# Patient Record
Sex: Female | Born: 1990 | Race: White | Hispanic: No | Marital: Married | Smoking: Current every day smoker
Health system: Southern US, Community
[De-identification: ages and names within clinical notes are randomized; demographics above are authoritative.]

## PROBLEM LIST (undated history)

## (undated) ENCOUNTER — Inpatient Hospital Stay: Payer: Self-pay

## (undated) DIAGNOSIS — D649 Anemia, unspecified: Secondary | ICD-10-CM

## (undated) DIAGNOSIS — I493 Ventricular premature depolarization: Secondary | ICD-10-CM

## (undated) DIAGNOSIS — J45909 Unspecified asthma, uncomplicated: Secondary | ICD-10-CM

## (undated) HISTORY — PX: NASAL SINUS SURGERY: SHX719

---
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
## 2009-01-05 ENCOUNTER — Encounter: Payer: Self-pay | Admitting: Pediatrics

## 2009-02-05 ENCOUNTER — Encounter: Payer: Self-pay | Admitting: Pediatrics

## 2012-05-11 ENCOUNTER — Ambulatory Visit: Payer: Self-pay | Admitting: Family Medicine

## 2015-11-08 DIAGNOSIS — I493 Ventricular premature depolarization: Secondary | ICD-10-CM

## 2015-11-08 HISTORY — DX: Ventricular premature depolarization: I49.3

## 2016-05-13 ENCOUNTER — Other Ambulatory Visit: Payer: Self-pay | Admitting: Obstetrics and Gynecology

## 2016-05-13 DIAGNOSIS — Z369 Encounter for antenatal screening, unspecified: Secondary | ICD-10-CM

## 2016-05-26 ENCOUNTER — Ambulatory Visit: Payer: Self-pay

## 2016-05-30 ENCOUNTER — Ambulatory Visit (HOSPITAL_BASED_OUTPATIENT_CLINIC_OR_DEPARTMENT_OTHER)
Admission: RE | Admit: 2016-05-30 | Discharge: 2016-05-30 | Disposition: A | Discharge status: Home / Self Care | Payer: Managed Care, Other (non HMO) | Source: Ambulatory Visit | Attending: Obstetrics and Gynecology | Admitting: Obstetrics and Gynecology

## 2016-05-30 ENCOUNTER — Ambulatory Visit
Admission: RE | Admit: 2016-05-30 | Discharge: 2016-05-30 | Disposition: A | Discharge status: Home / Self Care | Payer: Managed Care, Other (non HMO) | Source: Ambulatory Visit | Attending: Obstetrics and Gynecology | Admitting: Obstetrics and Gynecology

## 2016-05-30 DIAGNOSIS — O2620 Pregnancy care for patient with recurrent pregnancy loss, unspecified trimester: Secondary | ICD-10-CM | POA: Insufficient documentation

## 2016-05-30 DIAGNOSIS — N83292 Other ovarian cyst, left side: Secondary | ICD-10-CM | POA: Insufficient documentation

## 2016-05-30 DIAGNOSIS — O2621 Pregnancy care for patient with recurrent pregnancy loss, first trimester: Secondary | ICD-10-CM

## 2016-05-30 DIAGNOSIS — Z3481 Encounter for supervision of other normal pregnancy, first trimester: Secondary | ICD-10-CM | POA: Diagnosis not present

## 2016-05-30 DIAGNOSIS — Z369 Encounter for antenatal screening, unspecified: Secondary | ICD-10-CM

## 2016-05-30 DIAGNOSIS — Z3A12 12 weeks gestation of pregnancy: Secondary | ICD-10-CM | POA: Insufficient documentation

## 2016-05-30 DIAGNOSIS — Z36 Encounter for antenatal screening of mother: Secondary | ICD-10-CM | POA: Insufficient documentation

## 2016-05-30 HISTORY — DX: Ventricular premature depolarization: I49.3

## 2016-05-30 NOTE — Progress Notes (Signed)
Michelle Wells, MS, CGC performed an integral service incident to the physician's initial service.  I was physically present in the clinical area and was immediately available to render assistance.   [name] C Raevon Broom  

## 2016-05-30 NOTE — Progress Notes (Signed)
Referring physician:  [facility] Length of Consultation: 45 minutes   Michelle Benton  was referred to [facility] for genetic counseling to review prenatal screening and testing options.  This note summarizes the information we discussed.    We offered the following routine screening tests for this pregnancy:  First trimester screening, which includes nuchal translucency ultrasound screen and first trimester maternal serum marker screening.  The nuchal translucency has approximately an 80% detection rate for Down syndrome and can be positive for other chromosome abnormalities as well as congenital heart defects.  When combined with a maternal serum marker screening, the detection rate is up to 90% for Down syndrome and up to 97% for trisomy 18.     Maternal serum marker screening, a blood test that measures pregnancy proteins, can provide risk assessments for Down syndrome, trisomy 18, and open neural tube defects (spina bifida, anencephaly). Because it does not directly examine the fetus, it cannot positively diagnose or rule out these problems.  Targeted ultrasound uses high frequency sound waves to create an image of the developing fetus.  An ultrasound is often recommended as a routine means of evaluating the pregnancy.  It is also used to screen for fetal anatomy problems (for example, a heart defect) that might be suggestive of a chromosomal or other abnormality.   Should these screening tests indicate an increased concern, then the following additional testing options would be offered:  The chorionic villus sampling procedure is available for first trimester chromosome analysis.  This involves the withdrawal of a small amount of chorionic villi (tissue from the developing placenta).  Risk of pregnancy loss is estimated to be approximately 1 in 200 to 1 in 100 (0.5 to 1%).  There is approximately a 1% (1 in 100) chance that the CVS chromosome results will be unclear.   Chorionic villi cannot be tested for neural tube defects.     Amniocentesis involves the removal of a small amount of amniotic fluid from the sac surrounding the fetus with the use of a thin needle inserted through the maternal abdomen and uterus.  Ultrasound guidance is used throughout the procedure.  Fetal cells from amniotic fluid are directly evaluated and > 99.5% of chromosome problems and > 98% of open neural tube defects can be detected. This procedure is generally performed after the 15th week of pregnancy.  The main risks to this procedure include complications leading to miscarriage in less than 1 in 200 cases (0.5%).  As another option for information if the pregnancy is suspected to be an an increased chance for certain chromosome conditions, we also reviewed the availability of cell free fetal DNA testing from maternal blood to determine whether or not the baby may have either Down syndrome, trisomy 9, or trisomy 21.  This test utilizes a maternal blood sample and DNA sequencing technology to isolate circulating cell free fetal DNA from maternal plasma.  The fetal DNA can then be analyzed for DNA sequences that are derived from the three most common chromosomes involved in aneuploidy, chromosomes 13, 18, and 21.  If the overall amount of DNA is greater than the expected level for any of these chromosomes, aneuploidy is suspected.  While we do not consider it a replacement for invasive testing and karyotype analysis, a negative result from this testing would be reassuring, though not a guarantee of a normal chromosome complement for the baby.  An abnormal result is certainly suggestive of an abnormal chromosome complement, though  we would still recommend CVS or amniocentesis to confirm any findings from this testing.  Cystic Fibrosis and Spinal Muscular Atrophy (SMA) screening were also discussed with the patient. Both conditions are recessive, which means that both parents must be carriers in  order to have a child with the disease.  Cystic fibrosis (CF) is one of the most common genetic conditions in persons of Caucasian ancestry.  This condition occurs in approximately 1 in 2,500 Caucasian persons and results in thickened secretions in the lungs, digestive, and reproductive systems.  For a baby to be at risk for having CF, both of the parents must be carriers for this condition.  Approximately 1 in 50 Caucasian persons is a carrier for CF.  Current carrier testing looks for the most common mutations in the gene for CF and can detect approximately 90% of carriers in the Caucasian population.  This means that the carrier screening can greatly reduce, but cannot eliminate, the chance for an individual to have a child with CF.  If an individual is found to be a carrier for CF, then carrier testing would be available for the partner. As part of [place]'s newborn screening profile, all babies born in the state of [place] will have a two-tier screening process.  Specimens are first tested to determine the concentration of immunoreactive trypsinogen (IRT).  The top 5% of specimens with the highest IRT values then undergo DNA testing using a panel of over 40 common CF mutations. SMA is a neurodegenerative disorder that leads to atrophy of skeletal muscle and overall weakness.  This condition is also more prevalent in the Caucasian population, with 1 in 40-1 in 60 persons being a carrier and 1 in 6,000-1 in 10,000 children being affected.  There are multiple forms of the disease, with some causing death in infancy to other forms with survival into adulthood.  The genetics of SMA is complex, but carrier screening can detect up to 95% of carriers in the Caucasian population.  Similar to CF, a negative result can greatly reduce, but cannot eliminate, the chance to have a child with SMA.  We obtained a detailed family history and pregnancy history.  Michelle Benton reported that the father of the baby,  Michelle Benton, is 30 years old.  Advanced paternal age is known to be associated with an increased chance for several fetal health conditions.  Therefore, we also reviewed the issues surrounding advanced paternal age.  There is known to be an increase in single gene abnormalities in the children of men who are over age 85-50, though the specific age varies in different studies.  This chance is estimated to be no greater than 2%. These mutations may result in birth defects or genetic syndromes which may show differences on ultrasound in the second trimester.  Therefore, we recommend a detailed anatomy ultrasound at approximately [redacted] weeks gestation. A normal ultrasound cannot rule out these disorders. Advanced paternal age has also been associated with other conditions including autism spectrum disorders, schizophrenia and childhood acute lymphoblastic leukemia.  It is important to be aware of these associations, though no clinical testing is available for these conditions at this time.  Michelle Benton is also reported to have a paternal relative with deafness, the cause of which is not known.  There may be many different causes for hearing loss, some of which are inherited and others that are not.  We informed the patient that approximately 50% of congenital deafness is due to inherited changes within specific genes.  We reviewed the various causes for deafness including single gene disorders (both autosomal recessive and autosomal dominant), in-utero infection processes and sporadic occurrence.  Without a specific diagnosis as the cause for this condition, we cannot accurately determine the chance for this pregnancy to be similarly affected or offer any prenatal testing.  If more is learned about the cause for the hearing loss, we are happy to review this further.  Michelle Benton also reported a maternal first cousin with a "hole" in the heart.  We discussed that congenital heart defects (CHDs) occur in approximately 1 in 200 births.   Congenital heart defects are often thought to be multifactorial, meaning due to complex interactions between genetic and environmental factors, although some specific genetic changes and syndromes are associated with congenital heart defects.  It would be important to know what tests were done on this cousin in order to better assess the risk in this family.   In the absence of an underlying genetic condition, the chance of congenital heart defects in fourth degree relative of individuals with heart defects is not expected to be significantly increased above the background risk. Lastly, the patient's mother also had a history of four miscarriages, though she is said to have had PCOS and gynecological issues which led to a very early hysterectomy so it is not clear the reason for those losses.  The remainder of the family history is unremarkable for birth defects, developmental difference and known genetic conditions.   Michelle Benton reported that this is her fourth pregnancy.  The first three ended in very early spontaneous loss.  Following the losses, her OB ordered an evaluation including antiphosolipid antibodies, thrombophilia evaluation and maternal karyotype, all of which were normal.  She reported no complications or exposures in the current pregnancy that would be expected to increase the risk for birth defects.  After consideration of the options, Michelle Benton elected to proceed with first trimester screening and to decline carrier screening for both CF and SMA.  An ultrasound was performed at the time of the visit.  The gestational age was consistent with  13 weeks.  Fetal anatomy could not be assessed due to early gestational age.  Please refer to the ultrasound report for details of that study.  Michelle Benton was encouraged to call with questions or concerns.  We can be contacted at [phone].   Cherly Anderson, MS, CGC

## 2016-06-02 ENCOUNTER — Telehealth: Payer: Self-pay | Admitting: Obstetrics and Gynecology

## 2016-06-02 NOTE — Telephone Encounter (Signed)
Ms. Boyte  elected to undergo First Trimester screening on 05/30/16.  To review, first trimester screening, includes nuchal translucency ultrasound screen and/or first trimester maternal serum marker screening.  The nuchal translucency has approximately an 80% detection rate for Down syndrome and can be positive for other chromosome abnormalities as well as heart defects.  When combined with a maternal serum marker screening, the detection rate is up to 90% for Down syndrome and up to 97% for trisomy 13 and 18.     The results of the First Trimester Nuchal Translucency and Biochemical Screening were within normal range.  The risk for Down syndrome is now estimated to be less than 1 in 10,000.  The risk for Trisomy 13/18 is 1 in 9,464.  Should more definitive information be desired, we would offer amniocentesis.  Because we do not yet know the effectiveness of combined first and second trimester screening, we do not recommend a maternal serum screen to assess the chance for chromosome conditions.  However, if screening for neural tube defects is desired, maternal serum screening for AFP only can be performed between 15 and [redacted] weeks gestation.    Cherly Anderson, MS, CGC

## 2016-11-07 NOTE — L&D Delivery Note (Signed)
Delivery Note  First Stage: Labor onset: 11/27/16 @ 0415am  Augmentation : Pitocin  Analgesia Eliezer Lofts/Anesthesia intrapartum: Nitrous oxide, epidural SROM 11/27/16 at 0415  Second Stage: [facility] dilation [facility] 0145 [facility] Baseline: 145 bpm/ moderate variability/ +accels/ occasional variable decels to nadir of 70 bpm with each ctx   Delivery of a viable female "Wendelyn BreslowMark Wayne" at [phone] by Carlean JewsMeredith Areesha Dehaven, CNM in LOA position Double nuchal cord - reduced over head x 2 Cord double clamped after cessation of pulsation, cut by Support doula friend  Cord blood sample collected   Third Stage: Placenta delivered via Tomasa BlaseSchultz intact with 3 VC @ 0347 Placenta disposition: hospital disposal  Uterine tone firm with massage / bleeding moderate, but slowed with fundal massage and Pitocin 500mg  IV bolus  2nd degree laceration identified  Anesthesia for repair: 1% Lidocaine 30mL Repair: 2.0 and 3.0 vicryl Est. Blood Loss (mL): 400mL  Complications: none  Mom to postpartum.  Baby to Couplet care / Skin to Skin.  Newborn: Birth Weight: 3340 grams (7#9oz) Apgar Scores: 7, 9 Feeding planned: Breast  Carlean JewsMeredith Felipa Laroche, CNM

## 2016-11-09 LAB — OB RESULTS CONSOLE GBS: GBS: NEGATIVE

## 2016-11-27 ENCOUNTER — Inpatient Hospital Stay: Payer: Managed Care, Other (non HMO) | Admitting: Anesthesiology

## 2016-11-27 ENCOUNTER — Encounter: Payer: Self-pay | Admitting: *Deleted

## 2016-11-27 ENCOUNTER — Inpatient Hospital Stay
Admission: EM | Admit: 2016-11-27 | Discharge: 2016-11-29 | DRG: 775 | Disposition: A | Discharge status: Home / Self Care | Payer: Managed Care, Other (non HMO) | Attending: Obstetrics and Gynecology | Admitting: Obstetrics and Gynecology

## 2016-11-27 DIAGNOSIS — O99214 Obesity complicating childbirth: Secondary | ICD-10-CM | POA: Diagnosis present

## 2016-11-27 DIAGNOSIS — O4202 Full-term premature rupture of membranes, onset of labor within 24 hours of rupture: Secondary | ICD-10-CM | POA: Diagnosis present

## 2016-11-27 DIAGNOSIS — O2621 Pregnancy care for patient with recurrent pregnancy loss, first trimester: Secondary | ICD-10-CM

## 2016-11-27 DIAGNOSIS — Z87891 Personal history of nicotine dependence: Secondary | ICD-10-CM

## 2016-11-27 DIAGNOSIS — Z6836 Body mass index (BMI) 36.0-36.9, adult: Secondary | ICD-10-CM | POA: Diagnosis not present

## 2016-11-27 DIAGNOSIS — Z3A38 38 weeks gestation of pregnancy: Secondary | ICD-10-CM | POA: Diagnosis not present

## 2016-11-27 DIAGNOSIS — O9902 Anemia complicating childbirth: Secondary | ICD-10-CM | POA: Diagnosis present

## 2016-11-27 DIAGNOSIS — D72829 Elevated white blood cell count, unspecified: Secondary | ICD-10-CM | POA: Diagnosis present

## 2016-11-27 DIAGNOSIS — E669 Obesity, unspecified: Secondary | ICD-10-CM | POA: Diagnosis present

## 2016-11-27 DIAGNOSIS — O9912 Other diseases of the blood and blood-forming organs and certain disorders involving the immune mechanism complicating childbirth: Secondary | ICD-10-CM | POA: Diagnosis present

## 2016-11-27 DIAGNOSIS — Z369 Encounter for antenatal screening, unspecified: Secondary | ICD-10-CM

## 2016-11-27 DIAGNOSIS — D649 Anemia, unspecified: Secondary | ICD-10-CM | POA: Diagnosis present

## 2016-11-27 LAB — CHLAMYDIA/NGC RT PCR (ARMC ONLY)
Chlamydia Tr: NOT DETECTED
N gonorrhoeae: NOT DETECTED

## 2016-11-27 LAB — COMPREHENSIVE METABOLIC PANEL
ALT: 9 U/L — AB (ref 14–54)
ANION GAP: 10 (ref 5–15)
AST: 23 U/L (ref 15–41)
Albumin: 3.3 g/dL — ABNORMAL LOW (ref 3.5–5.0)
Alkaline Phosphatase: 138 U/L — ABNORMAL HIGH (ref 38–126)
BUN: 11 mg/dL (ref 6–20)
CALCIUM: 9.4 mg/dL (ref 8.9–10.3)
CO2: 20 mmol/L — AB (ref 22–32)
CREATININE: 0.57 mg/dL (ref 0.44–1.00)
Chloride: 107 mmol/L (ref 101–111)
Glucose, Bld: 69 mg/dL (ref 65–99)
Potassium: 3.4 mmol/L — ABNORMAL LOW (ref 3.5–5.1)
Sodium: 137 mmol/L (ref 135–145)
TOTAL PROTEIN: 6.4 g/dL — AB (ref 6.5–8.1)
Total Bilirubin: 0.3 mg/dL (ref 0.3–1.2)

## 2016-11-27 LAB — TYPE AND SCREEN
ABO/RH(D): O POS
ANTIBODY SCREEN: NEGATIVE

## 2016-11-27 LAB — PROTEIN / CREATININE RATIO, URINE
Creatinine, Urine: 41 mg/dL
Protein Creatinine Ratio: 0.17 mg/mg{Cre} — ABNORMAL HIGH (ref 0.00–0.15)
Total Protein, Urine: 7 mg/dL

## 2016-11-27 LAB — CBC
HEMATOCRIT: 38.7 % (ref 35.0–47.0)
Hemoglobin: 13.6 g/dL (ref 12.0–16.0)
MCH: 32.5 pg (ref 26.0–34.0)
MCHC: 35.2 g/dL (ref 32.0–36.0)
MCV: 92.3 fL (ref 80.0–100.0)
PLATELETS: 192 10*3/uL (ref 150–440)
RBC: 4.19 MIL/uL (ref 3.80–5.20)
RDW: 13 % (ref 11.5–14.5)
WBC: 13 10*3/uL — AB (ref 3.6–11.0)

## 2016-11-27 LAB — URIC ACID: URIC ACID, SERUM: 7.2 mg/dL — AB (ref 2.3–6.6)

## 2016-11-27 MED ORDER — SOD CITRATE-CITRIC ACID 500-334 MG/5ML PO SOLN: 30.0000 mL | ORAL | Status: DC | PRN

## 2016-11-27 MED ORDER — OXYTOCIN BOLUS FROM INFUSION
500.0000 mL | Freq: Once | INTRAVENOUS | Status: AC
  Administered 2016-11-28: 500 mL via INTRAVENOUS

## 2016-11-27 MED ORDER — FENTANYL 2.5 MCG/ML W/ROPIVACAINE 0.2% IN NS 100 ML EPIDURAL INFUSION (ARMC-ANES)
EPIDURAL | Status: DC | PRN
  Administered 2016-11-27: 10 mL/h via EPIDURAL

## 2016-11-27 MED ORDER — LACTATED RINGERS IV SOLN: 500.0000 mL | INTRAVENOUS | Status: DC | PRN

## 2016-11-27 MED ORDER — MISOPROSTOL 200 MCG PO TABS
ORAL_TABLET | ORAL | Status: AC
  Filled 2016-11-27: qty 4

## 2016-11-27 MED ORDER — OXYTOCIN 10 UNIT/ML IJ SOLN
INTRAMUSCULAR | Status: AC
  Filled 2016-11-27: qty 2

## 2016-11-27 MED ORDER — LIDOCAINE HCL (PF) 1 % IJ SOLN
30.0000 mL | INTRAMUSCULAR | Status: AC | PRN
  Administered 2016-11-27: 4 mL via SUBCUTANEOUS

## 2016-11-27 MED ORDER — ACETAMINOPHEN 325 MG PO TABS: 650.0000 mg | ORAL_TABLET | ORAL | Status: DC | PRN

## 2016-11-27 MED ORDER — TERBUTALINE SULFATE 1 MG/ML IJ SOLN: 0.2500 mg | Freq: Once | INTRAMUSCULAR | Status: DC | PRN

## 2016-11-27 MED ORDER — ONDANSETRON HCL 4 MG/2ML IJ SOLN
4.0000 mg | Freq: Four times a day (QID) | INTRAMUSCULAR | Status: DC | PRN
  Administered 2016-11-27: 4 mg via INTRAVENOUS
  Filled 2016-11-27: qty 2

## 2016-11-27 MED ORDER — BUPIVACAINE HCL (PF) 0.25 % IJ SOLN: INTRAMUSCULAR | Status: DC | PRN

## 2016-11-27 MED ORDER — CALCIUM CARBONATE ANTACID 500 MG PO CHEW
400.0000 mg | CHEWABLE_TABLET | Freq: Two times a day (BID) | ORAL | Status: DC | PRN
  Administered 2016-11-27 – 2016-11-28 (×2): 400 mg via ORAL
  Filled 2016-11-27 (×2): qty 2

## 2016-11-27 MED ORDER — BUTORPHANOL TARTRATE 1 MG/ML IJ SOLN: 1.0000 mg | INTRAMUSCULAR | Status: DC | PRN

## 2016-11-27 MED ORDER — AMMONIA AROMATIC IN INHA
RESPIRATORY_TRACT | Status: DC
Start: 2016-11-27 — End: 2016-11-28
  Filled 2016-11-27: qty 10

## 2016-11-27 MED ORDER — LIDOCAINE-EPINEPHRINE (PF) 1.5 %-1:200000 IJ SOLN: INTRAMUSCULAR | Status: DC | PRN

## 2016-11-27 MED ORDER — FENTANYL 2.5 MCG/ML W/ROPIVACAINE 0.2% IN NS 100 ML EPIDURAL INFUSION (ARMC-ANES)
EPIDURAL | Status: AC
  Filled 2016-11-27: qty 100

## 2016-11-27 MED ORDER — OXYTOCIN 40 UNITS IN LACTATED RINGERS INFUSION - SIMPLE MED
1.0000 m[IU]/min | INTRAVENOUS | Status: DC
  Administered 2016-11-27: 10 m[IU]/min via INTRAVENOUS
  Administered 2016-11-27: 2 m[IU]/min via INTRAVENOUS

## 2016-11-27 MED ORDER — LIDOCAINE-EPINEPHRINE (PF) 1.5 %-1:200000 IJ SOLN
INTRAMUSCULAR | Status: DC | PRN
  Administered 2016-11-27: 4 mL via EPIDURAL

## 2016-11-27 MED ORDER — OXYTOCIN 40 UNITS IN LACTATED RINGERS INFUSION - SIMPLE MED
2.5000 [IU]/h | INTRAVENOUS | Status: DC
  Filled 2016-11-27 (×2): qty 1000

## 2016-11-27 MED ORDER — LIDOCAINE HCL (PF) 1 % IJ SOLN
INTRAMUSCULAR | Status: AC
  Filled 2016-11-27: qty 30

## 2016-11-27 MED ORDER — BUPIVACAINE HCL (PF) 0.25 % IJ SOLN
INTRAMUSCULAR | Status: DC | PRN
  Administered 2016-11-27: 5 mL via EPIDURAL
  Administered 2016-11-28 (×2): 4 mL via EPIDURAL

## 2016-11-27 MED ORDER — LACTATED RINGERS IV SOLN
INTRAVENOUS | Status: DC
  Administered 2016-11-27 – 2016-11-28 (×4): via INTRAVENOUS

## 2016-11-27 NOTE — Progress Notes (Signed)
S:  Patient breathing well through contractions, using birth ball       Rating pain 4/10 - experiencing back pain       Has good support with her mother and doula at bedside      On Pitocin 10 milliunits  O:  VS: Blood pressure 121/81, pulse 66, temperature 97.6 F (36.4 C), temperature source Oral, resp. rate 16, height 5\' 4"  (1.626 m), weight 95.3 kg (210 lb), last menstrual period 03/01/2016.        FHR : baseline 130 bpm / variability moderate / accelerations + / no decelerations        Toco: contractions every 1-3 minutes / moderate         Cervix : Dilation: 3 Effacement (%): 80 Station: -3, -2 Presentation: Vertex Exam by:: m.[email or web address] cnm        Membranes: SROM - clear fluid   A: Latent labor     FHR category 1  P: Continue expectant management       SROM for >12 hours now - making change, afebrile       Reassess in 1-2 hours        Carlean JewsMeredith Sigmon, CNM

## 2016-11-27 NOTE — Anesthesia Procedure Notes (Signed)
Epidural Patient location during procedure: OB  Staffing Performed: anesthesiologist   Preanesthetic Checklist Completed: patient identified, site marked, surgical consent, pre-op evaluation, timeout performed, IV checked, risks and benefits discussed and monitors and equipment checked  Epidural Patient position: sitting Prep: Betadine Patient monitoring: heart rate, continuous pulse ox and blood pressure Approach: midline Location: L4-L5 Injection technique: LOR saline  Needle:  Needle type: Tuohy  Needle gauge: 17 G Needle length: 9 cm and 9 Needle insertion depth: 7 cm Catheter type: closed end flexible Catheter size: 19 Gauge Catheter at skin depth: 13 cm Test dose: negative and 1.5% lidocaine with Epi 1:200 K  Assessment Sensory level: T10 Events: blood not aspirated, injection not painful, no injection resistance, negative IV test and no paresthesia  Additional Notes   Patient tolerated the insertion well without complications.-SATD -IVTD. No paresthesia. Refer to [facility] nursing for VS and dosingReason for block:procedure for pain     

## 2016-11-27 NOTE — Anesthesia Preprocedure Evaluation (Signed)
Anesthesia Evaluation  Patient identified by MRN, date of birth, ID band Patient awake    Reviewed: Allergy & Precautions, H&P , NPO status , Patient's Chart, lab work & pertinent test results, reviewed documented beta blocker date and time   Airway Mallampati: II  TM Distance: >3 FB Neck ROM: full    Dental no notable dental hx. (+) Teeth Intact   Pulmonary neg pulmonary ROS, Current Smoker, former smoker,    Pulmonary exam normal breath sounds clear to auscultation       Cardiovascular Exercise Tolerance: Good negative cardio ROS   Rhythm:regular Rate:Normal     Neuro/Psych negative neurological ROS  negative psych ROS   GI/Hepatic negative GI ROS, Neg liver ROS,   Endo/Other  negative endocrine ROSdiabetes  Renal/GU      Musculoskeletal   Abdominal   Peds  Hematology negative hematology ROS (+)   Anesthesia Other Findings   Reproductive/Obstetrics (+) Pregnancy                             Anesthesia Physical Anesthesia Plan  ASA: II  Anesthesia Plan: Epidural   Post-op Pain Management:    Induction:   Airway Management Planned:   Additional Equipment:   Intra-op Plan:   Post-operative Plan:   Informed Consent: I have reviewed the patients History and Physical, chart, labs and discussed the procedure including the risks, benefits and alternatives for the proposed anesthesia with the patient or authorized representative who has indicated his/her understanding and acceptance.     Plan Discussed with:   Anesthesia Plan Comments:         Anesthesia Quick Evaluation  

## 2016-11-27 NOTE — Progress Notes (Addendum)
S:  Pt. Reports stronger ctxs, tearful at bedside      Using nitrous oxide with minimal relief     On birth ball and doula performing double hip squeeze and counter pressure during ctxs - has great support from husband, doula, and mother        On Pitocin 10 milliunits        Discussed pain management option and pt. Requests epidural  O:  VS: Blood pressure 120/70, pulse 63, temperature 97.6 F (36.4 C), temperature source Oral, resp. rate 18, height 5\' 4"  (1.626 m), weight 95.3 kg (210 lb), last menstrual period 03/01/2016.        FHR : baseline 125 bpm / variability moderate / accelerations + / no decelerations        Toco: contractions every 2-3 minutes / strong        Cervix : Dilation: 4 Effacement (%): 80 Station: -1, -2 Presentation: Vertex Exam by:: Molly trott, RN        Membranes: SROM - clear fluid  A: Latent labor     FHR category 1  P: Slow progression in latent phase       Anesthesia notified for epidural - discontinue nitrous oxide      Will reassess cervix after she is uncomfortable       Pt. Has been ruptured since 0415AM, clear fluid still noted, afebrile      Plan for IUPC placement once comfortable with epidural      Anticipate NSVD   Carlean JewsMeredith Reshaun Briseno, CNM

## 2016-11-27 NOTE — H&P (Signed)
OB ADMISSION/ HISTORY & PHYSICAL:  Admission Date: 11/27/2016  5:14 AM  Admit Diagnosis: Spontaneous rupture of membranes at 38+5 weeks  Concetta Leane CallWilson Folds is a [age] y.o. female presenting for spontaneous rupture of membranes at 38+5 weeks at 0415am for clear fluid.  She states she is having mild BH ctxs and intermittent low back pain.   Prenatal History: G4P0030   EDC : 12/06/2016, by Last Menstrual Period 03/01/16 [facility]  Prenatal course complicated by obesity, anemia   Prenatal Labs: ABO, Rh: --/--/O POS (01/21 16100558) Antibody: NEG (01/21 0558) Rubella:   Immune Varicella: Immune RPR:   NR HBsAg:   Negative HIV:   Negative GTT: 113 GBS:   Negative  First trimester: (Did they do cffDNA or NT/blood draw?) 05/30/16  NT: Down Syndrome risk: 1 in 10,000 Trisomy risk: 1 in 699,484  Second trimester (AFP/tetra): Declined 07/04/16 CF: declined 07/04/16  Flu: 09/06/16 Tdap: 11/01/16  Medical / Surgical History :  Past medical history:  Past Medical History:  Diagnosis Date  . Premature ventricular contractions 2017     Past surgical history:  Past Surgical History:  Procedure Laterality Date  . NASAL SINUS SURGERY      Family History: History reviewed. No pertinent family history.   Social History:  reports that she has quit smoking. She has never used smokeless tobacco. She reports that she does not drink alcohol or use drugs.   Allergies: Patient has no known allergies.    Current Medications at time of admission:  Prior to Admission medications   Medication Sig Start Date End Date Taking? Authorizing Provider  Prenatal Vit-Fe Fumarate-FA (PRENATAL MULTIVITAMIN) TABS tablet Take 1 tablet by mouth daily at 12 noon.   Yes Historical Provider, MD  ranitidine (ZANTAC) 150 MG tablet Take 150 mg by mouth 2 (two) times daily.   Yes Historical Provider, MD     Review of Systems: Active FM Denies HA, visual disturbances, epigastric pain onset  of BH ctxs last night currently every 2-5 minutes SROM @ 0415am for clear fluid  No Bloody Show   Physical Exam:  VS: Blood pressure (!) 100/59, pulse 82, temperature 97.7 F (36.5 C), temperature source Oral, resp. rate 16, height 5\' 4"  (1.626 m), weight 95.3 kg (210 lb), last menstrual period 03/01/2016.  General: alert and oriented, appears calm Heart: RRR Lungs: Clear lung fields Abdomen: Gravid, soft and non-tender, non-distended / uterus: gravid, non-tender Extremities: no edema  Genitalia / VE: Dilation: 1 (stripped membranes) Effacement (%): 80 Station: -2 Exam by:: M. Sigmon  FHR: baseline rate 140 bpm / variability moderate / accelerations + / no decelerations TOCO: every 5- 10 minutes/ mild   Assessment: 38+[redacted] weeks gestation Latent stage of labor FHR category 1 GBS Negative Elevated BP   [facility]:  [facility]     - Routine labor and delivery orders    - Pain control: Nitrous Oxide or epidural - declines IV narcotic pain meds    - Continuous fetal monitoring  2. GBS Negative     - No prophylaxis indicated 3. Initial elevated BP    - CMP, uric acid, P/c ratio 4. Labor Augmentation     - Mild ctxs without cervical change for 4 hours after SROM    - Initiate Pitocin - begin at 2 milliunit and increase by 2 milliunits  5. Postpartum    - Breast feeding    - Contraception: diaphragm 6. Anticipate NSVD    -  EFW by Leopold's 7#15oz  Dr. Elesa Massed notified of admission / plan of care  Carlean Jews, CNM

## 2016-11-27 NOTE — Progress Notes (Signed)
S:  States she is feeling some stronger contractions, but rating them 1-2/10      Used birth ball PRN     On Pitocin 10 milliunits   O:  VS: Blood pressure 124/87, pulse 72, temperature 97.5 F (36.4 C), temperature source Oral, resp. rate 14, height 5\' 4"  (1.626 m), weight 95.3 kg (210 lb), last menstrual period 03/01/2016.        FHR : baseline 130 bpm / variability moderate / accelerations + / no decelerations        Toco: contractions every 1-5 minutes / mild-moderate        Cervix : Declined exam        Membranes: SROM - clear fluid  A: Latent labor     FHR category 1  P: Continue Pitocin augmentation      Pain relief measures when ready      Reassess in 1-2 hours  Carlean JewsMeredith Sigmon, CNM

## 2016-11-28 LAB — CBC
HCT: 34.1 % — ABNORMAL LOW (ref 35.0–47.0)
HEMOGLOBIN: 12 g/dL (ref 12.0–16.0)
MCH: 32 pg (ref 26.0–34.0)
MCHC: 35.1 g/dL (ref 32.0–36.0)
MCV: 91.3 fL (ref 80.0–100.0)
Platelets: 174 10*3/uL (ref 150–440)
RBC: 3.73 MIL/uL — AB (ref 3.80–5.20)
RDW: 13.2 % (ref 11.5–14.5)
WBC: 20.2 10*3/uL — ABNORMAL HIGH (ref 3.6–11.0)

## 2016-11-28 LAB — RPR: RPR Ser Ql: NONREACTIVE

## 2016-11-28 MED ORDER — PHENYLEPHRINE 40 MCG/ML (10ML) SYRINGE FOR IV PUSH (FOR BLOOD PRESSURE SUPPORT)
80.0000 ug | PREFILLED_SYRINGE | INTRAVENOUS | Status: DC | PRN
  Filled 2016-11-28: qty 5

## 2016-11-28 MED ORDER — LACTATED RINGERS IV SOLN: 500.0000 mL | Freq: Once | INTRAVENOUS | Status: DC

## 2016-11-28 MED ORDER — FENTANYL 2.5 MCG/ML W/ROPIVACAINE 0.2% IN NS 100 ML EPIDURAL INFUSION (ARMC-ANES): 10.0000 mL/h | EPIDURAL | Status: AC

## 2016-11-28 MED ORDER — ONDANSETRON HCL 4 MG PO TABS: 4.0000 mg | ORAL_TABLET | ORAL | Status: DC | PRN

## 2016-11-28 MED ORDER — WITCH HAZEL-GLYCERIN EX PADS: 1.0000 "application " | MEDICATED_PAD | CUTANEOUS | Status: DC | PRN

## 2016-11-28 MED ORDER — OXYCODONE HCL 5 MG PO TABS: 10.0000 mg | ORAL_TABLET | ORAL | Status: DC | PRN

## 2016-11-28 MED ORDER — DIBUCAINE 1 % RE OINT: 1.0000 "application " | TOPICAL_OINTMENT | RECTAL | Status: DC | PRN

## 2016-11-28 MED ORDER — SENNOSIDES-DOCUSATE SODIUM 8.6-50 MG PO TABS: 2.0000 | ORAL_TABLET | ORAL | Status: DC

## 2016-11-28 MED ORDER — PRENATAL MULTIVITAMIN CH
1.0000 | ORAL_TABLET | Freq: Every day | ORAL | Status: DC
  Administered 2016-11-28: 1 via ORAL
  Filled 2016-11-28: qty 1

## 2016-11-28 MED ORDER — FERROUS SULFATE 325 (65 FE) MG PO TABS
325.0000 mg | ORAL_TABLET | Freq: Two times a day (BID) | ORAL | Status: DC
  Administered 2016-11-28 – 2016-11-29 (×3): 325 mg via ORAL
  Filled 2016-11-28 (×3): qty 1

## 2016-11-28 MED ORDER — OXYCODONE-ACETAMINOPHEN 5-325 MG PO TABS
ORAL_TABLET | ORAL | Status: AC
  Administered 2016-11-28: 1
  Filled 2016-11-28: qty 1

## 2016-11-28 MED ORDER — ACETAMINOPHEN 325 MG PO TABS: 650.0000 mg | ORAL_TABLET | ORAL | Status: DC | PRN

## 2016-11-28 MED ORDER — EPHEDRINE 5 MG/ML INJ
10.0000 mg | INTRAVENOUS | Status: DC | PRN
  Filled 2016-11-28: qty 2

## 2016-11-28 MED ORDER — BENZOCAINE-MENTHOL 20-0.5 % EX AERO
1.0000 "application " | INHALATION_SPRAY | CUTANEOUS | Status: DC | PRN
  Administered 2016-11-28: 1 via TOPICAL
  Filled 2016-11-28: qty 56

## 2016-11-28 MED ORDER — IBUPROFEN 600 MG PO TABS
600.0000 mg | ORAL_TABLET | Freq: Four times a day (QID) | ORAL | Status: DC
  Administered 2016-11-28 – 2016-11-29 (×6): 600 mg via ORAL
  Filled 2016-11-28 (×6): qty 1

## 2016-11-28 MED ORDER — ONDANSETRON HCL 4 MG/2ML IJ SOLN: 4.0000 mg | INTRAMUSCULAR | Status: DC | PRN

## 2016-11-28 MED ORDER — COCONUT OIL OIL: 1.0000 "application " | TOPICAL_OIL | Status: DC | PRN

## 2016-11-28 MED ORDER — DIPHENHYDRAMINE HCL 25 MG PO CAPS: 25.0000 mg | ORAL_CAPSULE | Freq: Four times a day (QID) | ORAL | Status: DC | PRN

## 2016-11-28 MED ORDER — SIMETHICONE 80 MG PO CHEW: 80.0000 mg | CHEWABLE_TABLET | ORAL | Status: DC | PRN

## 2016-11-28 MED ORDER — OXYCODONE HCL 5 MG PO TABS
5.0000 mg | ORAL_TABLET | ORAL | Status: DC | PRN
  Administered 2016-11-28 (×2): 5 mg via ORAL
  Filled 2016-11-28 (×3): qty 1

## 2016-11-28 MED ORDER — DIPHENHYDRAMINE HCL 50 MG/ML IJ SOLN: 12.5000 mg | INTRAMUSCULAR | Status: DC | PRN

## 2016-11-28 MED ORDER — ZOLPIDEM TARTRATE 5 MG PO TABS: 5.0000 mg | ORAL_TABLET | Freq: Every evening | ORAL | Status: DC | PRN

## 2016-11-28 NOTE — Progress Notes (Signed)
S:  Pain has not improved and is not well controlled despite bolus and increase in rate      Making adequate progression on Pitocin 5 milliunits   O:  VS: Blood pressure 135/85, pulse 99, temperature (P) 97.3 F (36.3 C), temperature source (P) Oral, resp. rate 16, height 5\' 4"  (1.626 m), weight 95.3 kg (210 lb), last menstrual period 03/01/2016, SpO2 96 %.        FHR : baseline 125 bpm/ variability minimal-moderate / accelerations + / occasional variable/early decelerations        Toco: contractions every 2-3 minutes / strong         Cervix : Dilation: 10 Dilation Complete Date: 11/28/16 Dilation Complete Time: 0145 Effacement (%): 100 Station: +1 Presentation: Vertex Exam by:: Alfonzo BeersMolly Trott, RN         Membranes: SROM - clear fluid  A: Active labor     FHR category 2  P: Caput noted, but anticipate NSVD soon      Not feeling urge to push  Michelle Benton, CNM

## 2016-11-28 NOTE — Discharge Summary (Addendum)
Obstetrical Discharge Summary  Patient Name: Michelle Doveriffany Wilson Benton DOB: [birth year] MRN: [id number]  Date of Admission: 11/27/2016 Date of Discharge: 11/29/16  [facility] Gestational Age at Delivery: 3219w6d   Antepartum complications: obesity, anemia, recurrent pregnancy loss Admitting Diagnosis: SROM at 38+5 weeks   Secondary Diagnosis: Patient Active Problem List   Diagnosis Date Noted  . Labor and delivery, indication for care 11/27/2016  . First trimester screening 05/30/2016  . 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 Presented with SROM on 11/27/16 and found to be 1cm.  She had a long latent stage of labor, but progressed quickly upon reaching active labor.  She pushed effectively over an intact perineum with significant perineal edema.  Delivery of a viable female "Wendelyn BreslowMark Wayne" at [phone] by Carlean JewsMeredith Sigmon, CNM in LOA position.  Double nuchal cord - reduced over head x 2 Cord double clamped after cessation of pulsation, cut by Support doula friend  Date of Delivery: 11/28/16 Delivered By: Carlean JewsMeredith Sigmon, CNM  Delivery Type: spontaneous vaginal delivery Anesthesia: epidural Placenta: sponatneous Laceration: 2nd degree  Episiotomy: none Newborn Data: Live born female  Birth Weight: 7 lb 9.3 oz (3440 g) APGAR: 7, 9  Postpartum Procedures: Leukocytosis - repeat CBC on 11/29/16 with appropriate decline  Post partum course: Patient's postpartum course was complicated by perineal edema and 2nd degree laceration.  She also had leukocytosis following delivery, with downward trend.  By time of discharge on PPD#1, her pain was controlled on oral pain medications; she had appropriate lochia and was ambulating, voiding without difficulty and tolerating regular diet.  She was deemed stable for discharge to home.    Discharge Physical Exam: BP 110/71 (BP  Location: Left Arm)   Pulse 80   Temp 98 F (36.7 C) (Oral)   Resp 18   Ht 5\' 4"  (1.626 m)   Wt 95.3 kg (210 lb)   LMP 03/01/2016   SpO2 99%   Breastfeeding? Unknown   BMI 36.05 kg/m   General: NAD CV: RRR Pulm: CTABL, nl effort ABD: s/nd/nt, fundus firm and below the umbilicus Lochia: moderate Perineum: + edema, well-approximated 2nd degree laceration healing well, no significant erythema DVT Evaluation: LE non-ttp, no evidence of DVT on exam.  Hemoglobin  Date Value Ref Range Status  11/29/2016 11.3 (L) 12.0 - 16.0 g/dL Final   HCT  Date Value Ref Range Status  11/29/2016 32.0 (L) 35.0 - 47.0 % Final     Disposition: stable, discharge to home. Baby Feeding: breast milk  Baby Disposition: home with mom  Rh Immune globulin given: N/A Rubella vaccine given: N/A Tdap vaccine given in AP or PP setting: UTD Flu vaccine given in AP or PP setting: UTD  Contraception: Diaphragm   Prenatal Labs:   Prenatal Labs: ABO, Rh: --/--/O POS (01/21 [id number]) Antibody: NEG (01/21 0558) Rubella:   Immune Varicella: Immune RPR:   NR HBsAg:   Negative HIV:   Negative GTT: 113 GBS:   Negative   Plan:  Michelle Benton was discharged to home in good condition. [facility]    Discharge Medications: Allergies as of 11/29/2016   No Known Allergies     Medication List    STOP taking these medications   ranitidine 150 MG tablet Commonly known as:  ZANTAC     TAKE these medications   prenatal multivitamin Tabs tablet Take 1 tablet by mouth  daily at 12 noon.       Follow-up Information    Karena Addison, CNM. Call in 6 week(s).   Specialty:  Certified Nurse Midwife Why:  Postpartum visit  Contact information: [address] [place] [address] [phone]           Signed: ----- Ranae Plumber, MD Attending Obstetrician and Gynecologist [facility], Department of OB/GYN [facility]

## 2016-11-28 NOTE — Discharge Instructions (Signed)
Care After Vaginal Delivery °Congratulations on your new baby!! ° °Refer to this sheet in the next few weeks. These discharge instructions provide you with information on caring for yourself after delivery. Your caregiver may also give you specific instructions. Your treatment has been planned according to the most current medical practices available, but problems sometimes occur. Call your caregiver if you have any problems or questions after you go home. ° °HOME CARE INSTRUCTIONS °· Take over-the-counter or prescription medicines only as directed by your caregiver or pharmacist. °· Do not drink alcohol, especially if you are breastfeeding or taking medicine to relieve pain. °· Do not chew or smoke tobacco. °· Do not use illegal drugs. °· Continue to use good perineal care. Good perineal care includes: °¨ Wiping your perineum from front to back. °¨ Keeping your perineum clean. °· Do not use tampons or douche until your caregiver says it is okay. °· Shower, wash your hair, and take tub baths as directed by your caregiver. °· Wear a well-fitting bra that provides breast support. °· Eat healthy foods. °· Drink enough fluids to keep your urine clear or pale yellow. °· Eat high-fiber foods such as whole grain cereals and breads, brown rice, beans, and fresh fruits and vegetables every day. These foods may help prevent or relieve constipation. °· Follow your caregiver's recommendations regarding resumption of activities such as climbing stairs, driving, lifting, exercising, or traveling. Specifically, no driving for two weeks, so that you are comfortable reacting quickly in an emergency. °· Talk to your caregiver about resuming sexual activities. Resumption of sexual activities is dependent upon your risk of infection, your rate of healing, and your comfort and desire to resume sexual activity. Usually we recommend waiting about six weeks, or until your bleeding stops and you are interested in sex. °· Try to have someone  help you with your household activities and your newborn for at least a few days after you leave the hospital. Even longer is better. °· Rest as much as possible. Try to rest or take a nap when your newborn is sleeping. Sleep deprivation can be very hard after delivery. °· Increase your activities gradually. °· Keep all of your scheduled postpartum appointments. It is very important to keep your scheduled follow-up appointments. At these appointments, your caregiver will be checking to make sure that you are healing physically and emotionally. ° °SEEK MEDICAL CARE IF:  °· You are passing large clots from your vagina.  °· You have a foul smelling discharge from your vagina. °· You have trouble urinating. °· You are urinating frequently. °· You have pain when you urinate. °· You have a change in your bowel movements. °· You have increasing redness, pain, or swelling near your vaginal incision (episiotomy) or vaginal tear. °· You have pus draining from your episiotomy or vaginal tear. °· Your episiotomy or vaginal tear is separating. °· You have painful, hard, or reddened breasts. °· You have a severe headache. °· You have blurred vision or see spots. °· You feel sad or depressed. °· You have thoughts of hurting yourself or your newborn. °· You have questions about your care, the care of your newborn, or medicines. °· You are dizzy or light-headed. °· You have a rash. °· You have nausea or vomiting. °· You were breastfeeding and have not had a menstrual period within 12 weeks after you stopped breastfeeding. °· You are not breastfeeding and have not had a menstrual period by the 12th week after delivery. °· You   have a fever. ° °SEEK IMMEDIATE MEDICAL CARE IF:  °· You have persistent pain. °· You have chest pain. °· You have shortness of breath. °· You faint. °· You have leg pain. °· You have stomach pain. °· Your vaginal bleeding saturates two or more sanitary pads in 1 hour. ° °MAKE SURE YOU:  °· Understand these  instructions. °· Will get help right away if you are not doing well or get worse. °·  °Document Released: 10/21/2000 Document Revised: 03/10/2014 Document Reviewed: 06/20/2012 ° °ExitCare® Patient Information ©2015 [employer]. This information is not intended to replace advice given to you by your health care provider. Make sure you discuss any questions you have with your health care provider. ° °

## 2016-11-28 NOTE — Progress Notes (Signed)
S:  Pt. Not comfortable with epidural, anesthesia has given bolus and increased dose      On Pitocin 10milliunits       Progressing quickly   O:  VS: Blood pressure 121/71, pulse 91, temperature 97.9 F (36.6 C), temperature source Axillary, resp. rate 18, height 5\' 4"  (1.626 m), weight 95.3 kg (210 lb), last menstrual period 03/01/2016, SpO2 96 %.        FHR : baseline 125 bpm / variability minimal-moderate / accelerations + / early decelerations        Toco: contractions every 2 minutes / strong         Cervix : Dilation: 7.5 Effacement (%): 90 Station: -1 Presentation: Vertex Exam by:: Roni BreadMoll Trott, RN        Membranes: SROM - clear fluid  A: Active labor     FHR category 2  P: Lateral position changes      Oxygen applied by facemask     Pitocin decreased to 5 milliunits      IV Fluid bolus    Anticipate NSVD  Dr. Elesa MassedWard updated  Carlean JewsMeredith Sigmon, CNM

## 2016-11-29 ENCOUNTER — Encounter: Payer: Self-pay | Admitting: *Deleted

## 2016-11-29 LAB — CBC WITH DIFFERENTIAL/PLATELET
BASOS ABS: 0.1 10*3/uL (ref 0–0.1)
BASOS PCT: 1 %
EOS PCT: 2 %
Eosinophils Absolute: 0.3 10*3/uL (ref 0–0.7)
HEMATOCRIT: 32 % — AB (ref 35.0–47.0)
Hemoglobin: 11.3 g/dL — ABNORMAL LOW (ref 12.0–16.0)
Lymphocytes Relative: 25 %
Lymphs Abs: 4 10*3/uL — ABNORMAL HIGH (ref 1.0–3.6)
MCH: 32.4 pg (ref 26.0–34.0)
MCHC: 35.3 g/dL (ref 32.0–36.0)
MCV: 91.9 fL (ref 80.0–100.0)
MONO ABS: 1.1 10*3/uL — AB (ref 0.2–0.9)
Monocytes Relative: 7 %
NEUTROS ABS: 10.5 10*3/uL — AB (ref 1.4–6.5)
Neutrophils Relative %: 65 %
PLATELETS: 197 10*3/uL (ref 150–440)
RBC: 3.49 MIL/uL — AB (ref 3.80–5.20)
RDW: 13.6 % (ref 11.5–14.5)
WBC: 16 10*3/uL — AB (ref 3.6–11.0)

## 2016-11-29 NOTE — Anesthesia Postprocedure Evaluation (Signed)
Anesthesia Post Note  Patient: Michelle Benton  Procedure(s) Performed: * No procedures listed *  Patient location during evaluation: Mother Baby Anesthesia Type: Epidural Level of consciousness: awake and alert Pain management: pain level controlled Vital Signs Assessment: post-procedure vital signs reviewed and stable Respiratory status: spontaneous breathing, nonlabored ventilation and respiratory function stable Cardiovascular status: stable Postop Assessment: no headache, no backache and epidural receding Anesthetic complications: no     Last Vitals:  Vitals:   11/28/16 1928 11/29/16 0114  BP: 121/76 124/79  Pulse: 85 79  Resp: 20 20  Temp: 36.8 C 36.5 C    Last Pain:  Vitals:   11/29/16 0114  TempSrc: Oral  PainSc:                  Jules SchickLogan,  Yanci Bachtell P

## 2016-11-29 NOTE — Progress Notes (Signed)
Discharge instructions given. Patient verbalizes understanding of teaching. Patient discharged home at 1500. 

## 2016-12-06 ENCOUNTER — Inpatient Hospital Stay
Admission: RE | Admit: 2016-12-06 | Payer: Managed Care, Other (non HMO) | Source: Ambulatory Visit | Admitting: Obstetrics and Gynecology

## 2017-11-07 NOTE — L&D Delivery Note (Signed)
Delivery Note  Date of delivery: 06/28/2018 Estimated Date of Delivery: 06/26/18 Patient's last menstrual period was 09/19/2017 (exact date). EGA: 7470w2d  First Stage: Labor onset: 06/28/2018 0100 Augmentation : none Analgesia Michelle Benton intrapartum: IV Stadol SROM at 1030 for clear fluid  Michelle Benton presented to L&D with contractions in active labor. She was expectantly managed. IV Stadol given for pain relief.   Second Stage: Complete dilation at 06/28/2018 1025 Onset of pushing at 1038 FHR second stage category II Delivery at 1049 on 06/28/2018  She progressed to complete and had a spontaneous vaginal birth of a live female over an intact perineum. The fetal head was delivered in direct OA position with restitution to ROA. No nuchal cord. Anterior then posterior shoulders delivered with minimal assistance. Baby placed on mom's abdomen and attended to by transition RN. Cord clamped and cut when pulseless by grandmother of the baby. Cord blood obtained for newborn labs.  Third Stage: Placenta delivered spontaneous intact with 3VC at 1101 Placenta disposition: routine disposal Uterine tone firm / bleeding scant IM pitocin given for hemorrhage prophylaxis as IV access was lost during delivery  2nd degree perineal laceration identified  Anesthesia for repair: lidocaine Repair: 3-0 Vicryl Rapide Est. Blood Loss (mL): 400  Complications: none  Mom to postpartum.  Baby to Couplet care / Skin to Skin.  Newborn: Birth Weight: pending  Apgar Scores: 8, 9 Feeding planned: breastfeeding   Michelle Benton, CNM 06/28/2018 11:28 AM

## 2017-12-05 ENCOUNTER — Other Ambulatory Visit: Payer: Self-pay | Admitting: Certified Nurse Midwife

## 2017-12-05 DIAGNOSIS — Z369 Encounter for antenatal screening, unspecified: Secondary | ICD-10-CM

## 2017-12-14 ENCOUNTER — Ambulatory Visit
Admission: RE | Admit: 2017-12-14 | Discharge: 2017-12-14 | Disposition: A | Discharge status: Home / Self Care | Payer: Medicaid Other | Source: Ambulatory Visit | Attending: Certified Nurse Midwife | Admitting: Certified Nurse Midwife

## 2017-12-14 ENCOUNTER — Ambulatory Visit (HOSPITAL_BASED_OUTPATIENT_CLINIC_OR_DEPARTMENT_OTHER)
Admission: RE | Admit: 2017-12-14 | Discharge: 2017-12-14 | Disposition: A | Discharge status: Home / Self Care | Payer: Medicaid Other | Source: Ambulatory Visit | Attending: Obstetrics & Gynecology | Admitting: Obstetrics & Gynecology

## 2017-12-14 ENCOUNTER — Encounter: Payer: Self-pay | Admitting: *Deleted

## 2017-12-14 VITALS — BP 126/68 | HR 83 | Temp 97.9°F | Resp 18 | Ht 64.8 in | Wt 172.6 lb

## 2017-12-14 DIAGNOSIS — Z315 Encounter for genetic counseling: Secondary | ICD-10-CM | POA: Diagnosis not present

## 2017-12-14 DIAGNOSIS — Z3682 Encounter for antenatal screening for nuchal translucency: Secondary | ICD-10-CM | POA: Insufficient documentation

## 2017-12-14 DIAGNOSIS — Z369 Encounter for antenatal screening, unspecified: Secondary | ICD-10-CM

## 2017-12-14 DIAGNOSIS — Z3A12 12 weeks gestation of pregnancy: Secondary | ICD-10-CM | POA: Diagnosis not present

## 2017-12-14 DIAGNOSIS — Z3481 Encounter for supervision of other normal pregnancy, first trimester: Secondary | ICD-10-CM | POA: Insufficient documentation

## 2017-12-14 NOTE — Progress Notes (Addendum)
Michelle Benton, Michelle Benton of Consultation: 20 minutes   Ms. Michelle Benton  was referred to [facility] for genetic counseling to review prenatal screening and testing options.  This note summarizes the information we discussed.    We offered the following routine screening tests for this pregnancy:  First trimester screening, which includes nuchal translucency ultrasound screen and first trimester maternal serum marker screening.  The nuchal translucency has approximately an 80% detection rate for Down syndrome and can be positive for other chromosome abnormalities as well as congenital heart defects.  When combined with a maternal serum marker screening, the detection rate is up to 90% for Down syndrome and up to 97% for trisomy 18.     Maternal serum marker screening, a blood test that measures pregnancy proteins, can provide risk assessments for Down syndrome, trisomy 18, and open neural tube defects (spina bifida, anencephaly). Because it does not directly examine the fetus, it cannot positively diagnose or rule out these problems.  Targeted ultrasound uses high frequency sound waves to create an image of the developing fetus.  An ultrasound is often recommended as a routine means of evaluating the pregnancy.  It is also used to screen for fetal anatomy problems (for example, a heart defect) that might be suggestive of a chromosomal or other abnormality.   Should these screening tests indicate an increased concern, then the following additional testing options would be offered:  The chorionic villus sampling procedure is available for first trimester chromosome analysis.  This involves the withdrawal of a small amount of chorionic villi (tissue from the developing placenta).  Risk of pregnancy loss is estimated to be approximately 1 in 200 to 1 in 100 (0.5 to 1%).  There is approximately a 1% (1 in 100) chance that the CVS chromosome results will be unclear.  Chorionic villi  cannot be tested for neural tube defects.     Amniocentesis involves the removal of a small amount of amniotic fluid from the sac surrounding the fetus with the use of a thin needle inserted through the maternal abdomen and uterus.  Ultrasound guidance is used throughout the procedure.  Fetal cells from amniotic fluid are directly evaluated and > 99.5% of chromosome problems and > 98% of open neural tube defects can be detected. This procedure is generally performed after the 15th week of pregnancy.  The main risks to this procedure include complications leading to miscarriage in less than 1 in 200 cases (0.5%).  As another option for information if the pregnancy is suspected to be an an increased chance for certain chromosome conditions, we also reviewed the availability of cell free fetal DNA testing from maternal blood to determine whether or not the baby may have either Down syndrome, trisomy 7513, or trisomy 7818.  This test utilizes a maternal blood sample and DNA sequencing technology to isolate circulating cell free fetal DNA from maternal plasma.  The fetal DNA can then be analyzed for DNA sequences that are derived from the three most common chromosomes involved in aneuploidy, chromosomes 13, 18, and 21.  If the overall amount of DNA is greater than the expected level for any of these chromosomes, aneuploidy is suspected.  While we do not consider it a replacement for invasive testing and karyotype analysis, a negative result from this testing would be reassuring, though not a guarantee of a normal chromosome complement for the baby.  An abnormal result is certainly suggestive of an abnormal chromosome complement, though we would still recommend  CVS or amniocentesis to confirm any findings from this testing.  Cystic Fibrosis and Spinal Muscular Atrophy (SMA) screening were also discussed with the patient. Both conditions are recessive, which means that both parents must be carriers in order to have a  child with the disease.  Cystic fibrosis (CF) is one of the most common genetic conditions in persons of Caucasian ancestry.  This condition occurs in approximately 1 in 2,500 Caucasian persons and results in thickened secretions in the lungs, digestive, and reproductive systems.  For a baby to be at risk for having CF, both of the parents must be carriers for this condition.  Approximately 1 in 76 Caucasian persons is a carrier for CF.  Current carrier testing looks for the most common mutations in the gene for CF and can detect approximately 90% of carriers in the Caucasian population.  This means that the carrier screening can greatly reduce, but cannot eliminate, the chance for an individual to have a child with CF.  If an individual is found to be a carrier for CF, then carrier testing would be available for the partner. As part of [place]'s newborn screening profile, all babies born in the state of [place] will have a two-tier screening process.  Specimens are first tested to determine the concentration of immunoreactive trypsinogen (IRT).  The top 5% of specimens with the highest IRT values then undergo DNA testing using a panel of over 40 common CF mutations. SMA is a neurodegenerative disorder that leads to atrophy of skeletal muscle and overall weakness.  This condition is also more prevalent in the Caucasian population, with 1 in 40-1 in 60 persons being a carrier and 1 in 6,000-1 in 10,000 children being affected.  There are multiple forms of the disease, with some causing death in infancy to other forms with survival into adulthood.  The genetics of SMA is complex, but carrier screening can detect up to 95% of carriers in the Caucasian population.  Similar to CF, a negative result can greatly reduce, but cannot eliminate, the chance to have a child with SMA.  We reviewed the detailed family history and pregnancy history obtained at her last genetic counseling visit on 05/30/2016.  Other  than the birth of her son, who is now 35 year old, she reported no changes in the history.  He is in good health, with no concerns about his growth or development.  At that time, we had reviewed data regarding advanced paternal age.  The father of the baby is now 3 years old.  Advanced paternal age is known to be associated with an increased chance for several fetal health conditions.  Therefore, we also reviewed the issues surrounding advanced paternal age.  There is known to be an increase in single gene abnormalities in the children of men who are over age 50-50, though the specific age varies in different studies.  This chance is estimated to be no greater than 2%. These mutations may result in birth defects or genetic syndromes which may show differences on ultrasound in the second trimester.  Therefore, we recommend a detailed anatomy ultrasound at approximately [redacted] weeks gestation. A normal ultrasound cannot rule out these disorders. Advanced paternal age has also been associated with other conditions including autism spectrum disorders, schizophrenia and childhood acute lymphoblastic leukemia.  It is important to be aware of these associations, though no clinical testing is available for these conditions at this time. Regarding the previously documented history of a cousin with a  congenital heart defect, Ms. Hatton stated that the child had genetic testing along with the parents.  She reported that the genetic testing showed the causative factor was inherited from the father, who is not related to our patient and therefore this pregnancy should not be at risk. However, we would need documentation of this testing to comment on recurrence risks.  The remainder of the family history was reported to be unremarkable for birth defects, intellectual delays, recurrent pregnancy loss or known chromosome abnormalities.  Ms. Sprowl stated that this is her fifth pregnancy.  As documented previously she had three early  miscarriages and a normal workup following those.  Her last pregnancy resulted in a term birth that was unremarkable.  She reported no complications or exposures in this pregnancy that would be expected to increase the risk for birth defects.  After consideration of the options, Ms. Tsuchiya elected to proceed with first trimester screening and again to decline CF and SMA carrier screening.  An ultrasound was performed at the time of the visit.  The gestational age was consistent with 12 weeks.  Fetal anatomy could not be assessed due to early gestational age.  Please refer to the ultrasound report for details of that study.  Ms. Hurston was encouraged to call with questions or concerns.  We can be contacted at [phone].  Testing ordered:  First trimester screening  Cherly Anderson, MS, CGC  Katrina Stack, MS, CGC performed an integral service incident to the physician's initial service.  I was physically present in the clinical area and was immediately available to render assistance.   [facility] Domenica Fail,  MD

## 2017-12-21 ENCOUNTER — Telehealth: Payer: Self-pay | Admitting: Obstetrics and Gynecology

## 2017-12-21 NOTE — Telephone Encounter (Signed)
Ms. Michelle Benton  elected to undergo First Trimester screening on 12/14/2017.  To review, first trimester screening, includes nuchal translucency ultrasound screen and/or first trimester maternal serum marker screening.  The nuchal translucency has approximately an 80% detection rate for Down syndrome and can be positive for other chromosome abnormalities as well as heart defects.  When combined with a maternal serum marker screening, the detection rate is up to 90% for Down syndrome and up to 97% for trisomy 13 and 18.     The results of the First Trimester Nuchal Translucency and Biochemical Screening were within normal range.  The risk for Down syndrome is now estimated to be 1 in 1,045.  The risk for Trisomy 13/18 is 1 in 4,954.  Should more definitive information be desired, we would offer amniocentesis.  Because we do not yet know the effectiveness of combined first and second trimester screening, we do not recommend a maternal serum screen to assess the chance for chromosome conditions.  However, if screening for neural tube defects is desired, maternal serum screening for AFP only can be performed between 15 and [redacted] weeks gestation.    We left a message for [name] to call for these results at her convenience.  Cherly Andersoneborah F. Tekeya Geffert, MS, CGC

## 2018-01-12 ENCOUNTER — Other Ambulatory Visit: Payer: Self-pay

## 2018-01-12 ENCOUNTER — Emergency Department
Admission: EM | Admit: 2018-01-12 | Discharge: 2018-01-12 | Disposition: A | Discharge status: Home / Self Care | Payer: Medicaid Other | Attending: Emergency Medicine | Admitting: Emergency Medicine

## 2018-01-12 ENCOUNTER — Emergency Department: Payer: Medicaid Other

## 2018-01-12 DIAGNOSIS — O219 Vomiting of pregnancy, unspecified: Secondary | ICD-10-CM | POA: Diagnosis not present

## 2018-01-12 DIAGNOSIS — Z3A16 16 weeks gestation of pregnancy: Secondary | ICD-10-CM | POA: Insufficient documentation

## 2018-01-12 DIAGNOSIS — O210 Mild hyperemesis gravidarum: Secondary | ICD-10-CM

## 2018-01-12 DIAGNOSIS — R1030 Lower abdominal pain, unspecified: Secondary | ICD-10-CM | POA: Diagnosis not present

## 2018-01-12 DIAGNOSIS — E860 Dehydration: Secondary | ICD-10-CM

## 2018-01-12 DIAGNOSIS — Z87891 Personal history of nicotine dependence: Secondary | ICD-10-CM | POA: Diagnosis not present

## 2018-01-12 LAB — URINALYSIS, COMPLETE (UACMP) WITH MICROSCOPIC
BILIRUBIN URINE: NEGATIVE
Glucose, UA: NEGATIVE mg/dL
Hgb urine dipstick: NEGATIVE
Ketones, ur: NEGATIVE mg/dL
Leukocytes, UA: NEGATIVE
NITRITE: NEGATIVE
PH: 6 (ref 5.0–8.0)
Protein, ur: NEGATIVE mg/dL
SPECIFIC GRAVITY, URINE: 1.011 (ref 1.005–1.030)

## 2018-01-12 LAB — COMPREHENSIVE METABOLIC PANEL
ALK PHOS: 38 U/L (ref 38–126)
ALT: 9 U/L — AB (ref 14–54)
AST: 16 U/L (ref 15–41)
Albumin: 3.9 g/dL (ref 3.5–5.0)
Anion gap: 10 (ref 5–15)
BUN: 12 mg/dL (ref 6–20)
CALCIUM: 9.2 mg/dL (ref 8.9–10.3)
CO2: 22 mmol/L (ref 22–32)
CREATININE: 0.45 mg/dL (ref 0.44–1.00)
Chloride: 104 mmol/L (ref 101–111)
Glucose, Bld: 82 mg/dL (ref 65–99)
Potassium: 3.6 mmol/L (ref 3.5–5.1)
Sodium: 136 mmol/L (ref 135–145)
Total Bilirubin: 0.5 mg/dL (ref 0.3–1.2)
Total Protein: 7.3 g/dL (ref 6.5–8.1)

## 2018-01-12 LAB — CBC WITH DIFFERENTIAL/PLATELET
Basophils Absolute: 0 10*3/uL (ref 0–0.1)
Basophils Relative: 0 %
Eosinophils Absolute: 0.5 10*3/uL (ref 0–0.7)
Eosinophils Relative: 4 %
HEMATOCRIT: 37.3 % (ref 35.0–47.0)
HEMOGLOBIN: 12.7 g/dL (ref 12.0–16.0)
LYMPHS PCT: 21 %
Lymphs Abs: 2.8 10*3/uL (ref 1.0–3.6)
MCH: 31.6 pg (ref 26.0–34.0)
MCHC: 34.2 g/dL (ref 32.0–36.0)
MCV: 92.4 fL (ref 80.0–100.0)
Monocytes Absolute: 0.8 10*3/uL (ref 0.2–0.9)
Monocytes Relative: 6 %
NEUTROS PCT: 69 %
Neutro Abs: 9.1 10*3/uL — ABNORMAL HIGH (ref 1.4–6.5)
Platelets: 288 10*3/uL (ref 150–440)
RBC: 4.03 MIL/uL (ref 3.80–5.20)
RDW: 13 % (ref 11.5–14.5)
WBC: 13.3 10*3/uL — AB (ref 3.6–11.0)

## 2018-01-12 MED ORDER — PROMETHAZINE HCL 25 MG/ML IJ SOLN
25.0000 mg | Freq: Once | INTRAMUSCULAR | Status: AC
  Administered 2018-01-12: 25 mg via INTRAVENOUS

## 2018-01-12 MED ORDER — SODIUM CHLORIDE 0.9 % IV BOLUS (SEPSIS)
1000.0000 mL | Freq: Once | INTRAVENOUS | Status: AC
  Administered 2018-01-12: 1000 mL via INTRAVENOUS

## 2018-01-12 MED ORDER — PROMETHAZINE HCL 25 MG/ML IJ SOLN
25.0000 mg | Freq: Once | INTRAMUSCULAR | Status: DC
  Filled 2018-01-12: qty 1

## 2018-01-12 NOTE — ED Triage Notes (Addendum)
Pt to ER via POV c/o nausea and emesis throughout pregnancy. Pt approx [redacted] weeks pregnant. Pt has been prescribed phenergan but states that it does not help. Denies vaginal bleeding or pregnancy complaints. Normal fetal movement. Pt reporting cough, congestion X 1 week as well.   Pt alert and oriented X4, active, cooperative, pt in NAD. RR even and unlabored, color WNL.   Drinking during triage.

## 2018-01-12 NOTE — ED Notes (Signed)
Pt to ultrasound

## 2018-01-12 NOTE — ED Notes (Signed)
Pt reports that she is having N/V since her pregnancy started - pt is [redacted] weeks pregnant - c/o headache and leg/abd cramps

## 2018-01-12 NOTE — ED Provider Notes (Signed)
[facility] Emergency Department Provider Note  ____________________________________________  Time seen: Approximately 6:14 PM  I have reviewed the triage vital signs and the nursing notes.   HISTORY  Chief Complaint Emesis During Pregnancy and Cough    HPI Michelle Benton is a 27 y.o. female who presents emergency department for nausea, vomiting, emesis.  Patient is [redacted] weeks pregnant.  She has been dealing with emesis throughout her pregnancy.  Patient reports that despite her antinausea medications, she has had increased emesis over the past several days.  She has had 4 episodes of emesis today.  Patient states that she feels "dry."  Patient is endorsing some mild lower abdominal cramping and lower back pain but denies any constipation, diarrhea, dysuria, polyuria, hematuria.  No vaginal bleeding or discharge.  Patient states that she can still feel fetal movement.  She has had no direct trauma to the back or the abdomen.  Patient's main complaint is emesis at this time.  Patient is G5P1.  Patient does have OB/GYN coverage.  Patient is taking prenatal vitamins, Phenergan, B6.  No other medications.  No other complaints at this time.  Past Medical History:  Diagnosis Date  . Premature ventricular contractions 2017    Patient Active Problem List   Diagnosis Date Noted  . Labor and delivery, indication for care 11/27/2016  . First trimester screening 05/30/2016  . 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 NASAL SINUS SURGERY      Prior to Admission medications   Medication Sig Start Date End Date Taking? Authorizing Provider  Prenatal Vit-Fe Fumarate-FA (PRENATAL MULTIVITAMIN) TABS tablet Take 1 tablet by mouth daily at 12 noon.    [provider]    Allergies Patient has no known allergies.  No family history on file.  Social History Social History    Tobacco Use  . Smoking status: Former Games developermoker  . Smokeless tobacco: Never Used  Substance Use Topics  . Alcohol use: No  . Drug use: No     Review of Systems  Constitutional: No fever/chills Eyes: No visual changes.  Cardiovascular: no chest pain. Respiratory: no cough. No SOB. Gastrointestinal: No abdominal pain.  Positive for nausea and emesis..  No diarrhea.  No constipation.  Patient is pregnant and is positive for fetal movement. Genitourinary: Negative for dysuria. No hematuria.  No vaginal bleeding or discharge. Musculoskeletal: Negative for musculoskeletal pain. Skin: Negative for rash, abrasions, lacerations, ecchymosis. Neurological: Negative for headaches, focal weakness or numbness. 10-point ROS otherwise negative.  ____________________________________________   PHYSICAL EXAM:  VITAL SIGNS: ED Triage Vitals  Enc Vitals Group     BP 01/12/18 1640 110/67     Pulse Rate 01/12/18 1640 84     Resp 01/12/18 1640 18     Temp 01/12/18 1640 (!) 97.5 F (36.4 C)     Temp Source 01/12/18 1640 Oral     SpO2 01/12/18 1640 99 %     Weight 01/12/18 1641 172 lb (78 kg)     Height --      Head Circumference --      Peak Flow --      Pain Score 01/12/18 1734 4     Pain Loc --      Pain Edu? --      Excl. in GC? --      Constitutional: Alert and oriented. Well appearing and in no acute distress. Eyes: Conjunctivae  are normal. PERRL. EOMI. Head: Atraumatic. ENT:      Ears:       Nose: No congestion/rhinnorhea.      Mouth/Throat: Mucous membranes are moist.  Neck: No stridor.   Hematological/Lymphatic/Immunilogical: No cervical lymphadenopathy. Cardiovascular: Normal rate, regular rhythm. Normal S1 and S2.  Good peripheral circulation. Respiratory: Normal respiratory effort without tachypnea or retractions. Lungs CTAB. Good air entry to the bases with no decreased or absent breath sounds. Gastrointestinal: Bowel sounds 4 quadrants. Soft and nontender to palpation.  No guarding or rigidity. No palpable masses. No distention. No CVA tenderness.  Gravid abdomen. Musculoskeletal: Full range of motion to all extremities. No gross deformities appreciated. Neurologic:  Normal speech and language. No gross focal neurologic deficits are appreciated.  Skin:  Skin is warm, dry and intact. No rash noted. Psychiatric: Mood and affect are normal. Speech and behavior are normal. Patient exhibits appropriate insight and judgement.   ____________________________________________   LABS (all labs ordered are listed, but only abnormal results are displayed)  Labs Reviewed  CBC WITH DIFFERENTIAL/PLATELET - Abnormal; Notable for the following components:      Result Value   WBC 13.3 (*)    Neutro Abs 9.1 (*)    All other components within normal limits  COMPREHENSIVE METABOLIC PANEL - Abnormal; Notable for the following components:   ALT 9 (*)    All other components within normal limits  URINALYSIS, COMPLETE (UACMP) WITH MICROSCOPIC - Abnormal; Notable for the following components:   Color, Urine YELLOW (*)    APPearance HAZY (*)    Bacteria, UA RARE (*)    Squamous Epithelial / LPF 0-5 (*)    All other components within normal limits  URINE CULTURE   ____________________________________________  EKG   ____________________________________________  RADIOLOGY Festus Barren Cuthriell, personally viewed and evaluated the written report by the radiologist.  US Ob Limited  Result Date: 01/12/2018 CLINICAL DATA:  Hyperemesis complicating pregnancy. EXAM: LIMITED OBSTETRIC ULTRASOUND FINDINGS: Number of Fetuses: 1 Heart Rate:  147 bpm Movement: Yes Presentation: Transverse Placental Location: Primarily posterior Previa: Marginal 1.3 cm from the cervical internal os. Amniotic Fluid (Subjective):  Within normal limits. BPD: 3.4 cm 16 w  3 d MATERNAL FINDINGS: Cervix:  Appears closed.  4.3 cm in length. Uterus/Adnexae: No abnormality visualized. IMPRESSION: 1. Single  live intrauterine pregnancy with a measured gestational age of [redacted] weeks and 3 days. 2. Placenta lies 1.3 cm from the internal os of the cervix, marginal. 3. No other pregnancy abnormality. This exam is performed on an emergent basis and does not comprehensively evaluate fetal size, dating, or anatomy; follow-up complete OB US should be considered if further fetal assessment is warranted. Electronically Signed   By: Amie Portland M.D.   On: 01/12/2018 19:42    ____________________________________________    PROCEDURES  Procedure(s) performed:    Procedures    Medications  sodium chloride 0.9 % bolus 1,000 mL (0 mLs Intravenous Stopped 01/12/18 2007)  promethazine (PHENERGAN) injection 25 mg (25 mg Intravenous Given 01/12/18 1837)     ____________________________________________   INITIAL IMPRESSION / ASSESSMENT AND PLAN / ED COURSE  Pertinent labs & imaging results that were available during my care of the patient were reviewed by me and considered in my medical decision making (see chart for details).  Review of the [place] CSRS was performed in accordance of the [facility] prior to dispensing any controlled drugs.     Patient's diagnosis is consistent with emesis in pregnancy, mild dehydration.  Patient presented after multiple episodes of emesis.  Her at home medications were not working.  Patient has had no return of emesis in the emergency department.  Patient had no other complaints.  However due to the lower abdominal cramping I evaluated the patient with ultrasound.  No complications of pregnancy visualized on ultrasound.  Labs are reassuring.  Patient does have some bacteria ileal cells in her urine.  At this time, I suspect that this is cross contamination from skin flora.  I will culture the patient's urine and if this returns with growth consistent with UTI, I will prescribe the patient antibiotics.  Patient states that she has adequate antinausea medications at home.  No new  prescriptions at this time.  Patient will follow up with OB/GYN as necessary. Patient is given ED precautions to return to the ED for any worsening or new symptoms.     ____________________________________________  FINAL CLINICAL IMPRESSION(S) / ED DIAGNOSES  Final diagnoses:  Vomiting during pregnancy  Mild dehydration      NEW MEDICATIONS STARTED DURING THIS VISIT:  ED Discharge Orders    None          This chart was dictated using voice recognition software/Dragon. Despite best efforts to proofread, errors can occur which can change the meaning. Any change was purely unintentional.    Racheal Patches, PA-C 01/12/18 2053    Minna Antis, MD 01/12/18 [phone]

## 2018-01-12 NOTE — ED Notes (Signed)
Pt returned from ultrasound

## 2018-01-12 NOTE — ED Notes (Signed)
Prior to discharge, pt states "the other nurse took out my iv". Pt had coat on, iv not visualized in arm. Pt insistent that iv is out of arm. Spoke with teresa, rn after pt left, teresa did not take pt's iv out. Spoke with pt on phone at [phone], number provided by man who answered phone at home number listed. Pt insists iv is out of her arm. Spoke with dawn, rn charge to notify of phone conversation with pt.

## 2018-01-14 LAB — URINE CULTURE: SPECIAL REQUESTS: NORMAL

## 2018-01-24 ENCOUNTER — Other Ambulatory Visit: Payer: Self-pay

## 2018-01-24 ENCOUNTER — Emergency Department
Admission: EM | Admit: 2018-01-24 | Discharge: 2018-01-24 | Disposition: A | Discharge status: Home / Self Care | Payer: Medicaid Other | Attending: Emergency Medicine | Admitting: Emergency Medicine

## 2018-01-24 DIAGNOSIS — Z3A18 18 weeks gestation of pregnancy: Secondary | ICD-10-CM | POA: Insufficient documentation

## 2018-01-24 DIAGNOSIS — O9A212 Injury, poisoning and certain other consequences of external causes complicating pregnancy, second trimester: Secondary | ICD-10-CM | POA: Insufficient documentation

## 2018-01-24 DIAGNOSIS — Z87891 Personal history of nicotine dependence: Secondary | ICD-10-CM | POA: Diagnosis not present
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
## 2018-01-24 NOTE — Discharge Instructions (Signed)
Follow-up with Dr. Dalbert GarnetBeasley on Friday.  Return to the emergency department if you feel that you are having contractions, bleeding or leaking from the sac.  Take Tylenol for any pain.  Use ice and wet heat for any injuries

## 2018-05-09 ENCOUNTER — Observation Stay
Admission: EM | Admit: 2018-05-09 | Discharge: 2018-05-09 | Disposition: A | Discharge status: Home / Self Care | Payer: Medicaid Other | Attending: Obstetrics & Gynecology | Admitting: Obstetrics & Gynecology

## 2018-05-09 DIAGNOSIS — Z3483 Encounter for supervision of other normal pregnancy, third trimester: Principal | ICD-10-CM | POA: Insufficient documentation

## 2018-05-09 NOTE — OB Triage Note (Signed)
Pt is here for leaking of fluid after urination for the last few weeks. Pt states when she finishes urinating, there is a small trickle of fluid. Pt denies ctxs, or vaginal bleeding. +FM.

## 2018-05-09 NOTE — Discharge Instructions (Signed)
Premature Rupture and Preterm Premature Rupture of Membranes °A sac made up of membranes surrounds your baby in the womb (uterus). Rupture of membranes is when this sac breaks open. This is also known as your "water breaking." When this sac breaks before labor starts, it is called premature rupture of membranes (PROM). If this happens before 37 weeks of being pregnant, it is called preterm premature rupture of membranes (PPROM). PPROM is serious. It needs medical care right away. °What increases the risk of PPROM? °PPROM is more likely to happen in women who: °· Have an infection. °· Have had PPROM before. °· Have a cervix that is short. °· Have bleeding during the second or third trimester. °· Have a low BMI. This is a measure of body fat. °· Smoke. °· Use drugs. °· Have a low socioeconomic status. ° °What problems can be caused by PROM and PPROM? °This condition creates health dangers for the mother and the baby. These include: °· Giving birth to the baby too early (prematurely). °· Getting a serious infection of the placenta (chorioamnionitis). °· Having the placenta detach from the uterus early (placental abruption). °· Squeezing of the umbilical cord. °· Getting a serious infection after delivery. ° °What are the signs of PROM and PPROM? °· A sudden gush of fluid from the vagina. °· A slow leak of fluid from the vagina. °· Your underwear is wet. °What should I do if I think my water broke? °Call your doctor right away. You will need to go to the hospital to get checked right away. °What happens if I am told that I have PROM or PPROM? °You will have tests done at the hospital. °· If you have PROM, you may be given medicine to start labor (be induced). This may be done if you are not having contractions during the 24 hours after your water broke. °· If you have PPROM and are not having contractions, you may be given medicine to start labor. It will depend on how far along you are in your pregnancy. ° °If you have  PPROM: °· You and your baby will be watched closely to see if you have infections or other problems. °· You may be given: °? An antibiotic medicine. This can stop an infection from starting. °? A steroid medicine. This can help your baby's lungs develop faster. °? A medicine to help prevent cerebral palsy in your baby. °? A medicine to stop early labor (preterm labor). °· You may be told to stay in bed except to use the bathroom (bed rest). °· You may be given medicine to start labor. This may be done if there are problems with you or the baby. ° °Your treatment will depend on many factors. °Contact a doctor if: °· Your water breaks and you are not having contractions. °Get help right away if: °· Your water breaks before you are [redacted] weeks pregnant. °Summary °· When your water breaks before labor starts, it is called premature rupture of membranes (PROM). °· When PROM happens before 37 weeks of pregnancy, it is called preterm premature rupture of membranes (PPROM). °· If you are not having contractions, your labor may be started for you. °This information is not intended to replace advice given to you by your health care provider. Make sure you discuss any questions you have with your health care provider. °Document Released: 01/20/2009 Document Revised: 07/14/2016 Document Reviewed: 07/14/2016 °Elsevier Interactive Patient Education © 2017 [employer] ° °

## 2018-05-12 NOTE — Discharge Summary (Addendum)
Patient's last menstrual period was 09/19/2017 (exact date). EDC: Estimated Date of Delivery: 06/26/18 EGA: [id number]  Patient presented for evaluation of leaking of fluids. Nitrazine was definitively negative.  I reviewed her vital signs and fetal tracing, both of which were reassuring.  Patient was discharged as she was not laboring, nor had PPROMed.  NST interpretation: Reactive.  Ranae Plumberhelsea Donaven Criswell, MD Attending Obstetrician and Gynecologist [facility]

## 2018-06-26 ENCOUNTER — Other Ambulatory Visit: Payer: Self-pay | Admitting: Obstetrics and Gynecology

## 2018-06-26 ENCOUNTER — Inpatient Hospital Stay: Admission: RE | Admit: 2018-06-26 | Payer: Medicaid Other | Source: Ambulatory Visit | Admitting: *Deleted

## 2018-06-26 NOTE — Progress Notes (Signed)
Admission orders for IOL. 

## 2018-06-28 ENCOUNTER — Other Ambulatory Visit: Payer: Self-pay

## 2018-06-28 ENCOUNTER — Inpatient Hospital Stay
Admission: EM | Admit: 2018-06-28 | Discharge: 2018-06-29 | DRG: 807 | Disposition: A | Discharge status: Home / Self Care | Payer: Medicaid Other | Attending: Certified Nurse Midwife | Admitting: Certified Nurse Midwife

## 2018-06-28 DIAGNOSIS — Z87891 Personal history of nicotine dependence: Secondary | ICD-10-CM

## 2018-06-28 DIAGNOSIS — O480 Post-term pregnancy: Principal | ICD-10-CM | POA: Diagnosis present

## 2018-06-28 DIAGNOSIS — Z3A40 40 weeks gestation of pregnancy: Secondary | ICD-10-CM

## 2018-06-28 HISTORY — DX: Anemia, unspecified: D64.9

## 2018-06-28 HISTORY — DX: Unspecified asthma, uncomplicated: J45.909

## 2018-06-28 LAB — CBC
HCT: 35.6 % (ref 35.0–47.0)
Hemoglobin: 12.8 g/dL (ref 12.0–16.0)
MCH: 33.1 pg (ref 26.0–34.0)
MCHC: 36 g/dL (ref 32.0–36.0)
MCV: 92 fL (ref 80.0–100.0)
Platelets: 266 10*3/uL (ref 150–440)
RBC: 3.87 MIL/uL (ref 3.80–5.20)
RDW: 12.7 % (ref 11.5–14.5)
WBC: 14.8 10*3/uL — ABNORMAL HIGH (ref 3.6–11.0)

## 2018-06-28 LAB — OB RESULTS CONSOLE RPR: RPR: NONREACTIVE

## 2018-06-28 LAB — OB RESULTS CONSOLE RUBELLA ANTIBODY, IGM: Rubella: IMMUNE

## 2018-06-28 LAB — OB RESULTS CONSOLE HIV ANTIBODY (ROUTINE TESTING): HIV: NONREACTIVE

## 2018-06-28 LAB — TYPE AND SCREEN
ABO/RH(D): O POS
Antibody Screen: NEGATIVE

## 2018-06-28 LAB — OB RESULTS CONSOLE VARICELLA ZOSTER ANTIBODY, IGG: Varicella: IMMUNE

## 2018-06-28 LAB — OB RESULTS CONSOLE GC/CHLAMYDIA
Chlamydia: NEGATIVE
Gonorrhea: NEGATIVE

## 2018-06-28 LAB — OB RESULTS CONSOLE GBS: STREP GROUP B AG: NEGATIVE

## 2018-06-28 LAB — OB RESULTS CONSOLE HEPATITIS B SURFACE ANTIGEN: Hepatitis B Surface Ag: NEGATIVE

## 2018-06-28 MED ORDER — AMMONIA AROMATIC IN INHA
RESPIRATORY_TRACT | Status: AC
  Filled 2018-06-28: qty 10

## 2018-06-28 MED ORDER — SODIUM CHLORIDE FLUSH 0.9 % IV SOLN
INTRAVENOUS | Status: AC
  Filled 2018-06-28: qty 10

## 2018-06-28 MED ORDER — OXYTOCIN 10 UNIT/ML IJ SOLN: 10.0000 [IU] | Freq: Once | INTRAMUSCULAR | Status: DC

## 2018-06-28 MED ORDER — IBUPROFEN 600 MG PO TABS
600.0000 mg | ORAL_TABLET | Freq: Four times a day (QID) | ORAL | Status: DC
  Administered 2018-06-28: 600 mg via ORAL
  Filled 2018-06-28: qty 1

## 2018-06-28 MED ORDER — ONDANSETRON HCL 4 MG/2ML IJ SOLN: 4.0000 mg | Freq: Four times a day (QID) | INTRAMUSCULAR | Status: DC | PRN

## 2018-06-28 MED ORDER — SENNOSIDES-DOCUSATE SODIUM 8.6-50 MG PO TABS
2.0000 | ORAL_TABLET | ORAL | Status: DC
  Administered 2018-06-28: 2 via ORAL
  Filled 2018-06-28: qty 2

## 2018-06-28 MED ORDER — COCONUT OIL OIL
1.0000 "application " | TOPICAL_OIL | Status: DC | PRN
  Administered 2018-06-28: 1 via TOPICAL
  Filled 2018-06-28: qty 120

## 2018-06-28 MED ORDER — ONDANSETRON HCL 4 MG PO TABS: 4.0000 mg | ORAL_TABLET | ORAL | Status: DC | PRN

## 2018-06-28 MED ORDER — LACTATED RINGERS IV SOLN: INTRAVENOUS | Status: DC

## 2018-06-28 MED ORDER — OXYTOCIN BOLUS FROM INFUSION: 500.0000 mL | Freq: Once | INTRAVENOUS | Status: DC

## 2018-06-28 MED ORDER — SOD CITRATE-CITRIC ACID 500-334 MG/5ML PO SOLN: 30.0000 mL | ORAL | Status: DC | PRN

## 2018-06-28 MED ORDER — ACETAMINOPHEN 325 MG PO TABS
650.0000 mg | ORAL_TABLET | ORAL | Status: DC | PRN
  Administered 2018-06-28 – 2018-06-29 (×4): 650 mg via ORAL
  Filled 2018-06-28 (×4): qty 2

## 2018-06-28 MED ORDER — DIPHENHYDRAMINE HCL 25 MG PO CAPS: 25.0000 mg | ORAL_CAPSULE | Freq: Four times a day (QID) | ORAL | Status: DC | PRN

## 2018-06-28 MED ORDER — BUTORPHANOL TARTRATE 1 MG/ML IJ SOLN
1.0000 mg | INTRAMUSCULAR | Status: DC | PRN
  Administered 2018-06-28 (×2): 1 mg via INTRAVENOUS
  Filled 2018-06-28 (×2): qty 1

## 2018-06-28 MED ORDER — IBUPROFEN 600 MG PO TABS
600.0000 mg | ORAL_TABLET | Freq: Four times a day (QID) | ORAL | Status: DC
  Administered 2018-06-28 – 2018-06-29 (×4): 600 mg via ORAL
  Filled 2018-06-28 (×4): qty 1

## 2018-06-28 MED ORDER — ONDANSETRON HCL 4 MG/2ML IJ SOLN: 4.0000 mg | INTRAMUSCULAR | Status: DC | PRN

## 2018-06-28 MED ORDER — LIDOCAINE HCL (PF) 1 % IJ SOLN
30.0000 mL | INTRAMUSCULAR | Status: DC | PRN
  Administered 2018-06-28: 30 mL via SUBCUTANEOUS

## 2018-06-28 MED ORDER — BENZOCAINE-MENTHOL 20-0.5 % EX AERO
1.0000 "application " | INHALATION_SPRAY | CUTANEOUS | Status: DC | PRN
  Administered 2018-06-28: 1 via TOPICAL
  Filled 2018-06-28: qty 56

## 2018-06-28 MED ORDER — LIDOCAINE HCL (PF) 1 % IJ SOLN
INTRAMUSCULAR | Status: AC
  Administered 2018-06-28: 30 mL via SUBCUTANEOUS
  Filled 2018-06-28: qty 30

## 2018-06-28 MED ORDER — PRENATAL MULTIVITAMIN CH
1.0000 | ORAL_TABLET | Freq: Every day | ORAL | Status: DC
  Administered 2018-06-28: 1 via ORAL
  Filled 2018-06-28: qty 1

## 2018-06-28 MED ORDER — OXYTOCIN 40 UNITS IN LACTATED RINGERS INFUSION - SIMPLE MED: 1.0000 m[IU]/min | INTRAVENOUS | Status: DC

## 2018-06-28 MED ORDER — DIBUCAINE 1 % RE OINT
1.0000 "application " | TOPICAL_OINTMENT | RECTAL | Status: DC | PRN
  Administered 2018-06-28: 1 via RECTAL
  Filled 2018-06-28: qty 28

## 2018-06-28 MED ORDER — OXYTOCIN 10 UNIT/ML IJ SOLN
INTRAMUSCULAR | Status: AC
  Administered 2018-06-28: 10 [IU]
  Filled 2018-06-28: qty 2

## 2018-06-28 MED ORDER — SIMETHICONE 80 MG PO CHEW: 160.0000 mg | CHEWABLE_TABLET | ORAL | Status: DC | PRN

## 2018-06-28 MED ORDER — OXYTOCIN 40 UNITS IN LACTATED RINGERS INFUSION - SIMPLE MED
2.5000 [IU]/h | INTRAVENOUS | Status: DC
  Filled 2018-06-28: qty 1000

## 2018-06-28 MED ORDER — TERBUTALINE SULFATE 1 MG/ML IJ SOLN: 0.2500 mg | Freq: Once | INTRAMUSCULAR | Status: DC | PRN

## 2018-06-28 MED ORDER — LACTATED RINGERS IV SOLN: 500.0000 mL | INTRAVENOUS | Status: DC | PRN

## 2018-06-28 MED ORDER — ACETAMINOPHEN 325 MG PO TABS: 650.0000 mg | ORAL_TABLET | ORAL | Status: DC | PRN

## 2018-06-28 MED ORDER — WITCH HAZEL-GLYCERIN EX PADS
1.0000 "application " | MEDICATED_PAD | CUTANEOUS | Status: DC | PRN
  Administered 2018-06-28: 1 via TOPICAL
  Filled 2018-06-28: qty 100

## 2018-06-28 MED ORDER — MISOPROSTOL 25 MCG QUARTER TABLET: 25.0000 ug | ORAL_TABLET | ORAL | Status: DC | PRN

## 2018-06-28 MED ORDER — MISOPROSTOL 200 MCG PO TABS
ORAL_TABLET | ORAL | Status: AC
  Filled 2018-06-28: qty 4

## 2018-06-28 NOTE — Progress Notes (Signed)
Difficult to record contractions via TOCO r/t frequent maternal position changes for comfort; contractions palpated by RN and are every 2-4 minutes, lasting 60-80 seconds.  Abdomen soft in between contractions; adequate rest.

## 2018-06-28 NOTE — Discharge Summary (Signed)
Obstetric Discharge Summary   Patient ID: Patient Name: Emeline Ginsiffany M Pujol DOB: October 29, 1991 MRN: [id number]  Date of Admission: 06/28/2018 Date of Delivery: 06/28/2018 Delivered by: Genia DelMargaret Haviland, CNM Date of Discharge: 06/29/2018  [facility]  MVH:[id number]'BLMP:Patient's last menstrual period was 09/19/2017 (exact date). EDC Estimated Date of Delivery: 06/26/18 Gestational Age at Delivery: 645w2d   Antepartum complications:  -Low back pain, sees chiropractor   Admitting Diagnosis: active labor  Secondary Diagnoses: Patient Active Problem List   Diagnosis Date Noted  . Post-term pregnancy, 40-42 weeks of gestation 06/28/2018  . Labor and delivery, indication for care 11/27/2016  . First trimester screening 05/30/2016  . 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 She was expectantly managed. IV Stadol given for pain relief. She progressed to complete and had a spontaneous vaginal birth of a live female over an intact perineum. The fetal head was delivered in direct OA position with restitution to ROA. No nuchal cord. Anterior then posterior shoulders delivered with minimal assistance. Baby placed on mom's abdomen and attended to by transition RN. Cord clamped and cut when pulseless by grandmother of the baby. Cord blood obtained for newborn labs. Placenta delivered spontaneously intact with 3VC. IM Pitocin given for hemorrhage prophylaxis as IV access was lost during delivery. 2nd degree perineal laceration identified. Repaired in the usual fashion with 3-0 Vicryl Rapide.   Delivery Type: spontaneous vaginal delivery Anesthesia: IV Stadol Placenta: spontaneous Laceration: 2nd degree perineal, repaired Episiotomy: none  Newborn Data: Live born female "Charlann NossJohn Henry" Birth Weight: 4110g 9lb1oz APGAR: 8, 9  Newborn  Delivery   Birth date/time:  06/28/2018 10:49:00 Delivery type:  Vaginal, Spontaneous     Postpartum Course  Patient had an uncomplicated postpartum course.  By time of discharge on PPD#1, her pain was controlled on oral pain medications; she had appropriate lochia and was ambulating, voiding without difficulty and tolerating regular diet.  She was deemed stable for discharge to home.       Labs: CBC Latest Ref Rng & Units 06/29/2018 06/28/2018 01/12/2018  WBC 3.6 - 11.0 K/uL 16.4(H) 14.8(H) 13.3(H)  Hemoglobin 12.0 - 16.0 g/dL 11.6(L) 12.8 12.7  Hematocrit 35.0 - 47.0 % 31.8(L) 35.6 37.3  Platelets 150 - 440 K/uL 247 266 288   O POS  Physical exam:  BP 96/61 (BP Location: Right Arm)   Pulse 79   Temp 97.6 F (36.4 C) (Oral)   Resp 16   Ht 5\' 4"  (1.626 m)   Wt 85.3 kg   LMP 09/19/2017 (Exact Date)   SpO2 98%   Breastfeeding? Unknown   BMI 32.27 kg/m  General: alert and no distress Pulm: normal respiratory effort Lochia: appropriate Abdomen: soft, NT Uterine Fundus: firm, below umbilicus Extremities: No evidence of DVT seen on physical exam. No lower extremity edema.   Disposition: stable, discharge to home Baby Feeding: breastmilk Baby Disposition: home with mom  Contraception: diaphragm  Prenatal Labs:  Blood type/Rh O+  Antibody screen neg  Rubella Immune  Varicella Immune  RPR NR  HBsAg Neg  HIV NR  GC neg  Chlamydia neg  Genetic screening negative  1 hour GTT 121  3 hour GTT n/a  GBS negative   Rh Immune globulin given: n/a Rubella vaccine given: n/a Tdap vaccine given in AP or PP setting: declined Flu vaccine given in AP or PP setting: n/a  Plan:  Nyjae DEYANIRA FESLER was discharged to home in good condition. Follow-up appointment at [facility] with delivery provider in 6 weeks  Discharge Instructions: Per After Visit Summary. Activity: Advance as tolerated. Pelvic rest for 6 weeks.   Diet: Regular Discharge Medications: Allergies as of  06/29/2018   No Known Allergies     Medication List    TAKE these medications   prenatal multivitamin Tabs tablet Take 1 tablet by mouth daily at 12 noon.      Outpatient follow up:  Follow-up Information    Genia Del, CNM. Schedule an appointment as soon as possible for a visit in 6 week(s).   Specialty:  Certified Nurse Midwife Why:  Please call to schedule your 6 week postpartum follow up appointment with Genia Del  Contact information: [address] [place] [address] [phone]            Signed:  ----- Ranae Plumber, MD Attending Obstetrician and Gynecologist [facility], Department of OB/GYN [facility]

## 2018-06-28 NOTE — Plan of Care (Signed)
Uncomplicated SVD female infant with 2nd degree laceration and IV pain medication for pain management., followed by uncomplicated initital PP recovery in L&D.  Lochia WNL.  Vital signs stable.  Patient up to bathroom and able to void x2.  Demonstrates appropriate bonding through skin to skin contact and initiation of breastfeeding.  Family members at bedside and supportive.  Transferred to PP unit via wheelchair in stable condition for continued care and assessessments.

## 2018-06-28 NOTE — Lactation Note (Signed)
This note was copied from a baby's chart. Lactation Consultation Note  Patient Name: Michelle Benton WUXLK'GToday's Date: 06/28/2018 Reason for consult: Follow-up assessment   Maternal Data    Feeding Feeding Type: Breast Fed(attempted, infant very fussy and placed skin to skin) Length of feed: 5 min  LATCH Score Latch: Repeated attempts needed to sustain latch, nipple held in mouth throughout feeding, stimulation needed to elicit sucking reflex.  Audible Swallowing: None  Type of Nipple: Everted at rest and after stimulation  Comfort (Breast/Nipple): Soft / non-tender  Hold (Positioning): Assistance needed to correctly position infant at breast and maintain latch.  LATCH Score: 6  Interventions Interventions: Skin to skin  Lactation Tools Discussed/Used     Consult Status Consult Status: PRN  LC called to room to assist with breastfeeding. When LC arrived, mother had infant latched with lips flanged and no discomfort for mother. Mother states that infant breast-fed for 20 minutes. LC attempted to assist again with breastfeeding once mother and infant were transferred to mother baby unit. Infant rooting vigorously and showing hunger cues but extremely fussy and there were several failed attempts at latching. Infant was placed skin to skin with mother and LC encouraged mother to stay skin to skin to calm infant before trying again to latch.  Michelle Benton 06/28/2018, 2:35 PM

## 2018-06-28 NOTE — H&P (Addendum)
Greg Belva BertinM Butkiewicz is a 27 y.o. female [phone]{1031 with LMP of 09/19/17 and EDD of 06/26/18 presenting for active labor since 0100 with UC's hurting and becoming more uncomfortable. Pt is a GNo LOF, VB or mec noted.  OB History    Gravida  5   Para      Term      Preterm      AB  3   Living  1     SAB  3   TAB      Ectopic      Multiple      Live Births             Past Medical History:  Diagnosis Date  . Premature ventricular contractions 2017  Acne, ADHD Allergic rhinitis, Anemia, LGSIL hx, seasonal allergies, Asthma, foot lesion hx, Chronic sinusitis Past Surgical History:  Procedure Laterality Date  . 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 She has never used smokeless tobacco. She reports that she does not drink alcohol or use drugs. POBHx:  OB History  Gravida Para Term Preterm AB Living  5 1 1 3 1   SAB TAB Ectopic Molar Multiple Live Births  1   # Outcome Date GA Lbr Len/2nd Weight Sex Delivery Anes PTL Lv  5 Current  4 Term 11/28/16 6530w6d 3.43 kg (7 lb 9 oz) M Vag-Spont EPI N LIV  3 AB 02/06/15 2159w0d  2 AB 11/07/12 6966w0d  1 AB 11/08/11 2159w0d     Maternal Diabetes: 121 Genetic Screening:12/14/17 neg NT, Declined AFP Maternal Ultrasounds/Referrals: Normal Anatomy Fetal Ultrasounds or other Referrals:N/A   Maternal Substance Abuse:  Neg Significant Maternal Medications:  PNV,    Review of Systems  Constitutional: Negative.   HENT: Negative.   Eyes: Negative.   Respiratory: Negative.   Cardiovascular: Negative.   Gastrointestinal: Negative.   Genitourinary: Negative.   Musculoskeletal: Negative.   Skin: Negative.   Neurological: Negative.   Endo/Heme/Allergies: Negative.   Psychiatric/Behavioral: Negative.    History Dilation: 4.5 Effacement (%): 60 Station: -1 Exam by:: D. Corse; M. Newton Blood pressure 123/88, pulse 81, temperature (!)  97.5 F (36.4 C), temperature source Oral, resp. rate 16, height 5\' 4"  (1.626 m), weight 85.3 kg, last menstrual period 09/19/2017, unknown if currently breastfeeding. Exam Physical Exam  .HEENT Gen:A,A&Ox3 HEENT: Normocephalic, Eyes non-icteric. HEART:S1S2, RRR, No M/R/G LUNGS:CTA bilat, no W/R/R ABD: Gravid, EFW 8#6oz Extrems:warm, dry, NT, Neg Homan's Cx:4-5/90/vtx-2 UC: q 3-5 mins,  Prenatal labs: ABO, Rh:  O pos Antibody:  Neg Rubella:  Immune  RPR:   NR HBsAg:   Negative HIV:   Neg GBS:   Neg  Varicella immune Assessment/Plan: A: IUP at 40 2/7 weeks 2. GBs neg 3. Active Labor P:1. Admit for deliverry 2. Labs per protocol 3. Uterine and fetal monitoring 4. Pt plans birth ball for pain control. _________________________ Myrtie Cruisearon W. Gaige Fussner,RN, MSN, CNM, FNP Certified Nurse Midwife [facility]  Sharee Pimplearon W Zaiyah Sottile 06/28/2018, 5:39 AM

## 2018-06-28 NOTE — Progress Notes (Signed)
Labor Progress Note  Michelle Benton is a 27 y.o. G5P1031 at 3469w2d by LMP c/w 403w5d ultrasound admitted for active labor.  Subjective:  Laying on right side in bed, feeling contractions strongly, coping well with support people at bedside.   Objective: BP (!) 111/59   Pulse 85   Temp (!) 97.5 F (36.4 C) (Oral)   Resp 16   Ht 5\' 4"  (1.626 m)   Wt 85.3 kg   LMP 09/19/2017 (Exact Date)   BMI 32.27 kg/m   Fetal Assessment: FHT:  FHR: 120 bpm, variability: moderate,  accelerations:  Present,  decelerations:  Absent Category/reactivity:  Category I UC:   regular, every 2-3 minutes SVE:   By RN Michelle Benton at [phone] Dilation: 7cm  Effacement: 70%  Station:  -1  Consistency: medium  Position: anterior  Membrane status: intact Amniotic color: n/a  Labs: Lab Results  Component Value Date   WBC 14.8 (H) 06/28/2018   HGB 12.8 06/28/2018   HCT 35.6 06/28/2018   MCV 92.0 06/28/2018   PLT 266 06/28/2018    Assessment / Plan: Spontaneous labor, progressing normally  Labor: Progressing normally, continue expectant management, plan for cervical recheck at 1015 or before if clinically indicated Fetal Wellbeing:  Category I Pain Control:  IV pain meds I/D:  n/a Anticipated MOD:  NSVD  Michelle Benton, CNM 06/28/2018, 9:13 AM

## 2018-06-28 NOTE — OB Triage Note (Signed)
Pt is a 40wk2d G2P1 seen in triage w/ a c/o of ctx. Pt states heer ctx started around 0130 this morning and have progressively gotten worse. Pt states her pain is a 6/10. Pt denies vaginal bleeding and LOF and states positive fetal movement. Monitors applied and assessing.

## 2018-06-29 LAB — CBC
HCT: 31.8 % — ABNORMAL LOW (ref 35.0–47.0)
Hemoglobin: 11.6 g/dL — ABNORMAL LOW (ref 12.0–16.0)
MCH: 33.8 pg (ref 26.0–34.0)
MCHC: 36.4 g/dL — ABNORMAL HIGH (ref 32.0–36.0)
MCV: 93 fL (ref 80.0–100.0)
Platelets: 247 10*3/uL (ref 150–440)
RBC: 3.42 MIL/uL — AB (ref 3.80–5.20)
RDW: 12.8 % (ref 11.5–14.5)
WBC: 16.4 10*3/uL — AB (ref 3.6–11.0)

## 2018-06-29 LAB — RPR: RPR: NONREACTIVE

## 2018-06-29 MED ORDER — OXYCODONE HCL 5 MG PO TABS
5.0000 mg | ORAL_TABLET | Freq: Once | ORAL | Status: AC | PRN
  Administered 2018-06-29: 5 mg via ORAL
  Filled 2018-06-29: qty 1

## 2018-06-29 NOTE — Progress Notes (Addendum)
Discharge order received from doctor. Reviewed discharge instructions with patient and answered all questions. Follow up appointment instructions given. Patient verbalized understanding. ID bands checked. Patient discharged home with infant via wheelchair by nursing/auxillary.   Seiji Wiswell Garner, RN  

## 2018-06-29 NOTE — Progress Notes (Signed)
   06/29/18 1000  Clinical Encounter Type  Visited With Patient and family together  Visit Type Initial    Chaplain received an OR to update or complete an AD for the patient. Patient awake and patient's mother was gently cradling the newborn upon entry into the room. Patient was sitting up on her bed and very welcoming. Chaplain engaged patient and her mother in brief conversation about her son and his name, Michelle Benton. They shared the origin of his name and it's meaning to their family. Chaplain spoke words of encouragement and positive affirmation for the entry of this new life into the world, for their village to love, nurture and care for him as he needed, and for him to grow into who is called to be. Patient noted interest in learning about the AD and wished to review information later. Will call or page if needed.

## 2018-06-29 NOTE — Discharge Instructions (Signed)
Please call your doctor or return to the ER if you experience any chest pains, shortness of breath, dizziness, visual changes, fever greater than 101, any heavy bleeding (saturating more than 1 pad per hour), large clots, or foul smelling discharge, any worsening abdominal pain and cramping that is not controlled by pain medication, or any signs of postpartum depression. No tampons, enemas, douches, or sexual intercourse for 6 weeks. Also avoid tub baths, hot tubs, or swimming for 6 weeks.  °

## 2018-06-29 NOTE — Lactation Note (Signed)
This note was copied from a baby's chart. Lactation Consultation Note  Patient Name: Michelle Benton WUJWJ'XToday's Date: 06/29/2018 Reason for consult: Follow-up assessment Baby had had a long span of not nursing, currently nursing well, swallows heard, mom just recently stopped breastfeeding her 2418 mth old, baby's stools have transitioned to greenish   Maternal Data Formula Feeding for Exclusion: No Has patient been taught Hand Expression?: Yes Does the patient have breastfeeding experience prior to this delivery?: Yes  Feeding Feeding Type: Breast Fed Length of feed: 35 min(30 min right , 5 min left)  LATCH Score Latch: Grasps breast easily, tongue down, lips flanged, rhythmical sucking.  Audible Swallowing: Spontaneous and intermittent  Type of Nipple: Everted at rest and after stimulation  Comfort (Breast/Nipple): Soft / non-tender  Hold (Positioning): No assistance needed to correctly position infant at breast.  LATCH Score: 10  Interventions Interventions: (mom encouraged and reassured)  Lactation Tools Discussed/Used WIC Program: No   Consult Status Consult Status: Complete    Dyann KiefMarsha D Lavonte Palos 06/29/2018, 12:18 PM

## 2019-07-14 ENCOUNTER — Other Ambulatory Visit: Payer: Self-pay

## 2019-07-14 ENCOUNTER — Emergency Department: Payer: Self-pay

## 2019-07-14 ENCOUNTER — Emergency Department
Admission: EM | Admit: 2019-07-14 | Discharge: 2019-07-14 | Disposition: A | Discharge status: Home / Self Care | Payer: Self-pay | Attending: Emergency Medicine | Admitting: Emergency Medicine

## 2019-07-14 DIAGNOSIS — Z20828 Contact with and (suspected) exposure to other viral communicable diseases: Secondary | ICD-10-CM | POA: Insufficient documentation

## 2019-07-14 DIAGNOSIS — J45909 Unspecified asthma, uncomplicated: Secondary | ICD-10-CM | POA: Insufficient documentation

## 2019-07-14 DIAGNOSIS — R638 Other symptoms and signs concerning food and fluid intake: Secondary | ICD-10-CM | POA: Insufficient documentation

## 2019-07-14 DIAGNOSIS — Z79899 Other long term (current) drug therapy: Secondary | ICD-10-CM | POA: Insufficient documentation

## 2019-07-14 DIAGNOSIS — Z87891 Personal history of nicotine dependence: Secondary | ICD-10-CM | POA: Insufficient documentation

## 2019-07-14 DIAGNOSIS — R197 Diarrhea, unspecified: Secondary | ICD-10-CM | POA: Insufficient documentation

## 2019-07-14 DIAGNOSIS — K529 Noninfective gastroenteritis and colitis, unspecified: Secondary | ICD-10-CM

## 2019-07-14 DIAGNOSIS — R1110 Vomiting, unspecified: Secondary | ICD-10-CM | POA: Insufficient documentation

## 2019-07-14 LAB — COMPREHENSIVE METABOLIC PANEL
ALT: 14 U/L (ref 0–44)
AST: 18 U/L (ref 15–41)
Albumin: 4 g/dL (ref 3.5–5.0)
Alkaline Phosphatase: 43 U/L (ref 38–126)
Anion gap: 13 (ref 5–15)
BUN: 12 mg/dL (ref 6–20)
CO2: 22 mmol/L (ref 22–32)
Calcium: 8.5 mg/dL — ABNORMAL LOW (ref 8.9–10.3)
Chloride: 100 mmol/L (ref 98–111)
Creatinine, Ser: 0.64 mg/dL (ref 0.44–1.00)
GFR calc Af Amer: >60 mL/min (ref >60)
GFR calc non Af Amer: >60 mL/min (ref >60)
Glucose, Bld: 97 mg/dL (ref 70–99)
Potassium: 3.1 mmol/L — ABNORMAL LOW (ref 3.5–5.1)
Sodium: 135 mmol/L (ref 135–145)
Total Bilirubin: 0.9 mg/dL (ref 0.3–1.2)
Total Protein: 6.8 g/dL (ref 6.5–8.1)

## 2019-07-14 LAB — URINALYSIS, ROUTINE W REFLEX MICROSCOPIC
Glucose, UA: NEGATIVE mg/dL
Hgb urine dipstick: NEGATIVE
Ketones, ur: 20 mg/dL — AB
Leukocytes,Ua: NEGATIVE
Nitrite: NEGATIVE
Protein, ur: 100 mg/dL — AB
Specific Gravity, Urine: 1.03 (ref 1.005–1.030)
pH: 6 (ref 5.0–8.0)

## 2019-07-14 LAB — CBC WITH DIFFERENTIAL/PLATELET
Abs Immature Granulocytes: 0.03 10*3/uL (ref 0.00–0.07)
Basophils Absolute: 0 10*3/uL (ref 0.0–0.1)
Basophils Relative: 0 %
Eosinophils Absolute: 0 10*3/uL (ref 0.0–0.5)
Eosinophils Relative: 0 %
HCT: 38.7 % (ref 36.0–46.0)
Hemoglobin: 13.5 g/dL (ref 12.0–15.0)
Immature Granulocytes: 0 %
Lymphocytes Relative: 18 %
Lymphs Abs: 1.6 10*3/uL (ref 0.7–4.0)
MCH: 31.8 pg (ref 26.0–34.0)
MCHC: 34.9 g/dL (ref 30.0–36.0)
MCV: 91.1 fL (ref 80.0–100.0)
Monocytes Absolute: 1.2 10*3/uL — ABNORMAL HIGH (ref 0.1–1.0)
Monocytes Relative: 14 %
Neutro Abs: 5.7 10*3/uL (ref 1.7–7.7)
Neutrophils Relative %: 68 %
Platelets: 224 10*3/uL (ref 150–400)
RBC: 4.25 MIL/uL (ref 3.87–5.11)
RDW: 12.4 % (ref 11.5–15.5)
WBC: 8.5 10*3/uL (ref 4.0–10.5)
nRBC: 0 % (ref 0.0–0.2)

## 2019-07-14 LAB — SARS CORONAVIRUS 2 BY RT PCR (HOSPITAL ORDER, PERFORMED IN ~~LOC~~ HOSPITAL LAB): SARS Coronavirus 2: NEGATIVE

## 2019-07-14 LAB — POCT PREGNANCY, URINE: Preg Test, Ur: NEGATIVE

## 2019-07-14 LAB — LIPASE, BLOOD: Lipase: 16 U/L (ref 11–51)

## 2019-07-14 MED ORDER — ONDANSETRON HCL 4 MG PO TABS: 4.0000 mg | ORAL_TABLET | Freq: Every day | ORAL | 0 refills | Status: DC | PRN

## 2019-07-14 MED ORDER — FENTANYL CITRATE (PF) 100 MCG/2ML IJ SOLN
50.0000 ug | Freq: Once | INTRAMUSCULAR | Status: AC
  Administered 2019-07-14: 10:00:00 50 ug via INTRAVENOUS
  Filled 2019-07-14: qty 2

## 2019-07-14 MED ORDER — SODIUM CHLORIDE 0.9 % IV BOLUS
1000.0000 mL | Freq: Once | INTRAVENOUS | Status: AC
  Administered 2019-07-14: 10:00:00 1000 mL via INTRAVENOUS

## 2019-07-14 MED ORDER — IOHEXOL 300 MG/ML  SOLN
100.0000 mL | Freq: Once | INTRAMUSCULAR | Status: AC | PRN
  Administered 2019-07-14: 11:00:00 100 mL via INTRAVENOUS

## 2019-07-14 MED ORDER — ONDANSETRON HCL 4 MG/2ML IJ SOLN
4.0000 mg | Freq: Once | INTRAMUSCULAR | Status: AC
  Administered 2019-07-14: 10:00:00 4 mg via INTRAVENOUS
  Filled 2019-07-14: qty 2

## 2019-07-14 MED ORDER — CIPROFLOXACIN HCL 500 MG PO TABS: 500.0000 mg | ORAL_TABLET | Freq: Two times a day (BID) | ORAL | 0 refills | Status: AC

## 2019-07-14 MED ORDER — POTASSIUM CHLORIDE CRYS ER 20 MEQ PO TBCR
40.0000 meq | EXTENDED_RELEASE_TABLET | Freq: Once | ORAL | Status: AC
  Administered 2019-07-14: 12:00:00 40 meq via ORAL
  Filled 2019-07-14: qty 2

## 2019-07-14 NOTE — Discharge Instructions (Addendum)
Take the Cipro for the colitis and the zofran for nausea.  Your K was slightly low at 3.1.  Return to ED for not tolerating eating, fevers, or any other concerns.   Do not breastfeed while taking the Cipro and two days after the last dose of Cipro.    IMPRESSION:  1. Mild diffuse colitis, likely infectious in etiology although  differential diagnosis also includes ulcerative colitis.  2. Tiny amount of free fluid in pelvis. No evidence of abscess or  other complication.

## 2019-07-14 NOTE — ED Triage Notes (Signed)
Pt c/o generalized abd pain./pressure with N/V/D, back pain for the past 3 days. Pt is in NAD on arrival. Ambulatory to triage.

## 2019-07-14 NOTE — ED Provider Notes (Signed)
[facility] Emergency Department Provider Note  ____________________________________________   First MD Initiated Contact with Patient 07/14/19 [id number]     (approximate)  I have reviewed the triage vital signs and the nursing notes.   HISTORY  Chief Complaint Abdominal Pain    HPI Michelle Benton is a 28 y.o. female with anemia who presents with abdominal pain.  Patient endorses having 4 days of abdominal pain.  The abdominal pain is mostly on her right side.  She says it is worse in the right lower quadrant.  The pain is constant, nothing makes it better, nothing makes it worse.  Is been associated with some decreased p.o. intake, vomiting x2 nonbloody nonbilious, diarrhea 5 episodes yesterday.  Denies any known coronavirus contacts.  Has been febrile up to 101 at home.  Did take ibuprofen before coming in so patient is afebrile here.  Denies history of vaginal discharge or concerns for STDs.  Denies any urinary symptoms.  No shortness of breath or cough.       Past Medical History:  Diagnosis Date  . Anemia   . Asthma   . Premature ventricular contractions 2017    Patient Active Problem List   Diagnosis Date Noted  . Post-term pregnancy, 40-42 weeks of gestation 06/28/2018  . Labor and delivery, indication for care 11/27/2016  . First trimester screening 05/30/2016  . 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 NASAL SINUS SURGERY      Prior to Admission medications   Medication Sig Start Date End Date Taking? Authorizing Provider  Prenatal Vit-Fe Fumarate-FA (PRENATAL MULTIVITAMIN) TABS tablet Take 1 tablet by mouth daily at 12 noon.    [provider]    Allergies Patient has no known allergies.  No family history on file.  Social History Social History   Tobacco Use  . Smoking status: Former Smoker    Quit date: 06/28/2016    Years since  quitting: 3.0  . Smokeless tobacco: Never Used  Substance Use Topics  . Alcohol use: No  . Drug use: No      Review of Systems Constitutional: Positive for fever Eyes: No visual changes. ENT: No sore throat. Cardiovascular: Denies chest pain. Respiratory: Denies shortness of breath. Gastrointestinal: Positive abdominal pain, diarrhea, vomiting Genitourinary: Negative for dysuria. Musculoskeletal: Negative for back pain. Skin: Negative for rash. Neurological: Negative for headaches, focal weakness or numbness. All other ROS negative ____________________________________________   PHYSICAL EXAM:  VITAL SIGNS: ED Triage Vitals  Enc Vitals Group     BP 07/14/19 0856 105/67     Pulse Rate 07/14/19 0856 (!) 102     Resp 07/14/19 0856 16     Temp 07/14/19 0856 98.9 F (37.2 C)     Temp Source 07/14/19 0856 Oral     SpO2 07/14/19 0856 95 %     Weight 07/14/19 0852 160 lb (72.6 kg)     Height 07/14/19 0852 5\' 4"  (1.626 m)     Head Circumference --      Peak Flow --      Pain Score 07/14/19 0852 5     Pain Loc --      Pain Edu? --      Excl. in GC? --     Constitutional: Alert and oriented. Well appearing and in no acute distress. Eyes: Conjunctivae are normal. EOMI. Head: Atraumatic. Nose: No congestion/rhinnorhea. Mouth/Throat: Mucous membranes are moist.  Neck: No stridor. Trachea Midline. FROM Cardiovascular: Tachycardic, regular rhythm. Grossly normal heart sounds.  Good peripheral circulation. Respiratory: Normal respiratory effort.  No retractions. Lungs CTAB. Gastrointestinal: Tender in the right lower quadrant with positive Rovsing sign. no guarding. No distention. No abdominal bruits.  Musculoskeletal: No lower extremity tenderness nor edema.  No joint effusions. Neurologic:  Normal speech and language. No gross focal neurologic deficits are appreciated.  Skin:  Skin is warm, dry and intact. No rash noted. Psychiatric: Mood and affect are normal. Speech and  behavior are normal. GU: Deferred   ____________________________________________   LABS (all labs ordered are listed, but only abnormal results are displayed)  Labs Reviewed  CBC WITH DIFFERENTIAL/PLATELET - Abnormal; Notable for the following components:      Result Value   Monocytes Absolute 1.2 (*)    All other components within normal limits  COMPREHENSIVE METABOLIC PANEL - Abnormal; Notable for the following components:   Potassium 3.1 (*)    Calcium 8.5 (*)    All other components within normal limits  URINALYSIS, ROUTINE W REFLEX MICROSCOPIC - Abnormal; Notable for the following components:   Color, Urine AMBER (*)    APPearance CLOUDY (*)    Bilirubin Urine SMALL (*)    Ketones, ur 20 (*)    Protein, ur 100 (*)    Bacteria, UA FEW (*)    All other components within normal limits  SARS CORONAVIRUS 2 (HOSPITAL ORDER, [place] [facility])  GASTROINTESTINAL PANEL BY PCR, STOOL (REPLACES STOOL CULTURE)  LIPASE, BLOOD  POC URINE PREG, ED  POCT PREGNANCY, URINE   ____________________________________________   RADIOLOGY  Official radiology report(s): Ct Abdomen Pelvis W Contrast  Result Date: 07/14/2019 CLINICAL DATA:  Generalized abdominal pain and pressure for 3 days. Nausea, vomiting, and diarrhea. Fever. EXAM: CT ABDOMEN AND PELVIS WITH CONTRAST TECHNIQUE: Multidetector CT imaging of the abdomen and pelvis was performed using the standard protocol following bolus administration of intravenous contrast. CONTRAST:  138mL OMNIPAQUE IOHEXOL 300 MG/ML  SOLN COMPARISON:  None. FINDINGS: Lower Chest: No acute findings. Hepatobiliary: No hepatic masses identified. Gallbladder is unremarkable. No evidence of biliary ductal dilatation. Pancreas:  No mass or inflammatory changes. Spleen: Within normal limits in size and appearance. Adrenals/Urinary Tract: No masses identified. No evidence of hydronephrosis. Unremarkable unopacified urinary bladder. Stomach/Bowel: Mild  wall thickening and increased mucosal enhancement is seen involving the ascending, transverse, and descending colon, consistent with diffuse colitis. Normal appendix visualized. No evidence of small bowel involvement. Tiny amount of free fluid is seen in the pelvis, however there is no evidence of abscess or bowel obstruction. Vascular/Lymphatic: Shotty sub-cm mesenteric lymph nodes are seen in the right lower quadrant, likely reactive in etiology. No pathologically enlarged lymph nodes. No abdominal aortic aneurysm. Reproductive:  No mass or other significant abnormality. Other:  None. Musculoskeletal:  No suspicious bone lesions identified. IMPRESSION: 1. Mild diffuse colitis, likely infectious in etiology although differential diagnosis also includes ulcerative colitis. 2. Tiny amount of free fluid in pelvis. No evidence of abscess or other complication. Electronically Signed   By: Marlaine Hind M.D.   On: 07/14/2019 11:00    ____________________________________________   PROCEDURES  Procedure(s) performed (including Critical Care):  Procedures   ____________________________________________   INITIAL IMPRESSION / ASSESSMENT AND PLAN / ED COURSE  Michelle Benton was evaluated in Emergency Department on 07/14/2019 for the symptoms described in the history of present illness. She was evaluated in the context of the global COVID-19 pandemic, which  necessitated consideration that the patient might be at risk for infection with the SARS-CoV-2 virus that causes COVID-19. Institutional protocols and algorithms that pertain to the evaluation of patients at risk for COVID-19 are in a state of rapid change based on information released by regulatory bodies including the CDC and federal and state organizations. These policies and algorithms were followed during the patient's care in the ED.    Patient is a 28 year old who presents tachycardic with right lower quadrant pain.  Patient was febrile yesterday to  101 although she took ibuprofen this morning so she is afebrile here.  Given patient's pain in her right lower quadrant in the setting of fevers, vomiting, diarrhea this is most concerning for possible appendicitis.  I discussion with patient about CT and patient would like to proceed with CT scan.  Will get pregnancy test to rule ectopic, basic labs throughout pancreatitis, cholecystitis.  No symptoms to suggest UTI, pneumonia.  Low suspicion for bacteremia.  Pregnancy test is negative.  White count is normal.  Liver function is normal.  Urine does have 6-10 WBCs but also is not a great sample with 21-50 squamous cells.  Patient has no urinary symptoms so we will hold off on treatment for UTI.  CT scan is concerning for possible colitis.  Given patient's age this is most likely infectious in nature.  Provided patient report.  Patient given GI's number for follow-up in case this is ulcerative colitis..  Will give a course of Cipro.  No risk factors for C. Difficile.  Reevaluated patient.  Patient is feeling better after the fluids and medications.  Patient feels comfortable with the plan for follow-up outpatient.  ____________________________________________   FINAL CLINICAL IMPRESSION(S) / ED DIAGNOSES   Final diagnoses:  Colitis      MEDICATIONS GIVEN DURING THIS VISIT:  Medications  potassium chloride SA (K-DUR) CR tablet 40 mEq (has no administration in time range)  sodium chloride 0.9 % bolus 1,000 mL (0 mLs Intravenous Stopped 07/14/19 1137)  fentaNYL (SUBLIMAZE) injection 50 mcg (50 mcg Intravenous Given 07/14/19 0934)  ondansetron (ZOFRAN) injection 4 mg (4 mg Intravenous Given 07/14/19 0934)  iohexol (OMNIPAQUE) 300 MG/ML solution 100 mL (100 mLs Intravenous Contrast Given 07/14/19 1038)     ED Discharge Orders         Ordered    ondansetron (ZOFRAN) 4 MG tablet  Daily PRN     07/14/19 1141    ciprofloxacin (CIPRO) 500 MG tablet  2 times daily     07/14/19 1153            Note:  This document was prepared using Dragon voice recognition software and may include unintentional dictation errors.   Concha Se, MD 07/14/19 1154

## 2019-11-08 NOTE — L&D Delivery Note (Signed)
Delivery Note At 4:56 AM a viable and healthy female was delivered via Vaginal, Spontaneous (Presentation: Left Occiput Anterior).  APGAR: 9, 9; weight  pending.   Placenta status: Spontaneous, Intact.  Cord: 3 vessels with the following complications: None.  Anesthesia: None Episiotomy: None Lacerations:  None Est. Blood Loss (mL):  200 Mom to postpartum.  Baby to Couplet care / Skin to Skin. Active labor at term, no epidural. Pushed over intact perineum, delivered en caul, baby girl placed on maternal abdomen. No dystocia, placenta delivered spontaneously intact after cord pulsing. Bleeding minimal, fundus firm. IM Pitocin given. Pt requested IV out promptly after birth. Will decline neonatal interventions except for Vitamin K. Mom and baby tolerated procedure well.   Christeen Douglas 05/27/2020, 5:59 AM

## 2020-01-31 IMAGING — US US OB LIMITED
1 series · 14 of 28 positions shown · non-contrast
Comparison: none

CLINICAL DATA: Hyperemesis complicating pregnancy.

EXAM:
LIMITED OBSTETRIC ULTRASOUND

[Series 1: us ob limited · 0.25mm/px · 14 of 51 slices shown]
[im 2/51]
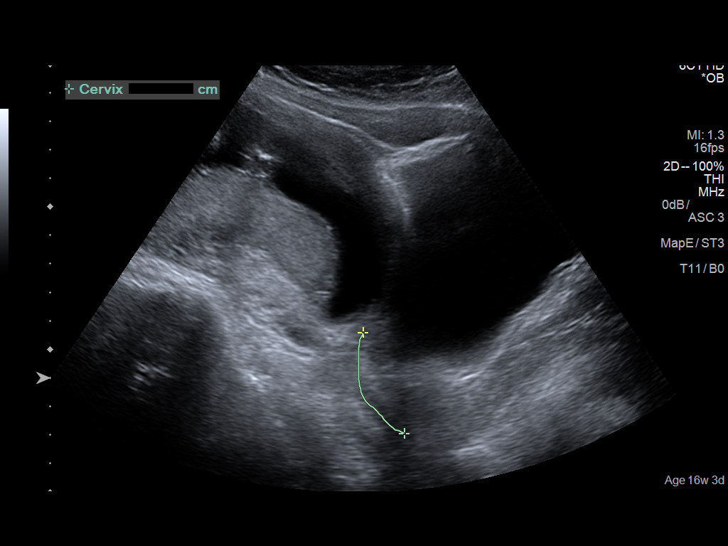
[im 6/51]
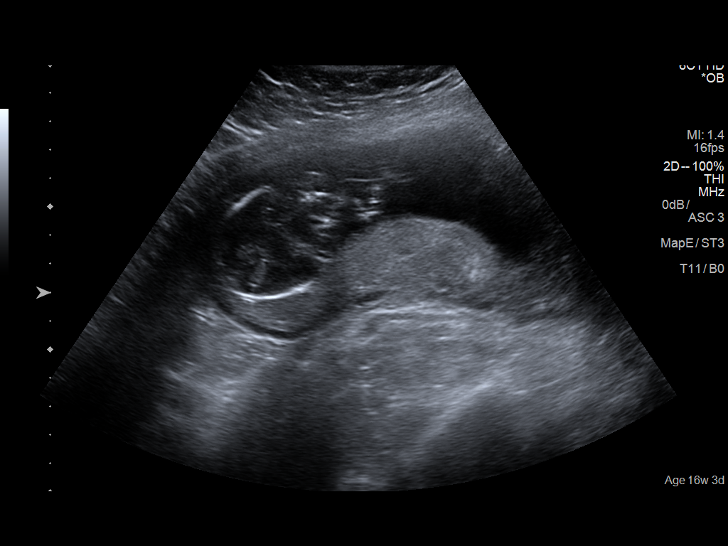
[im 10/51]
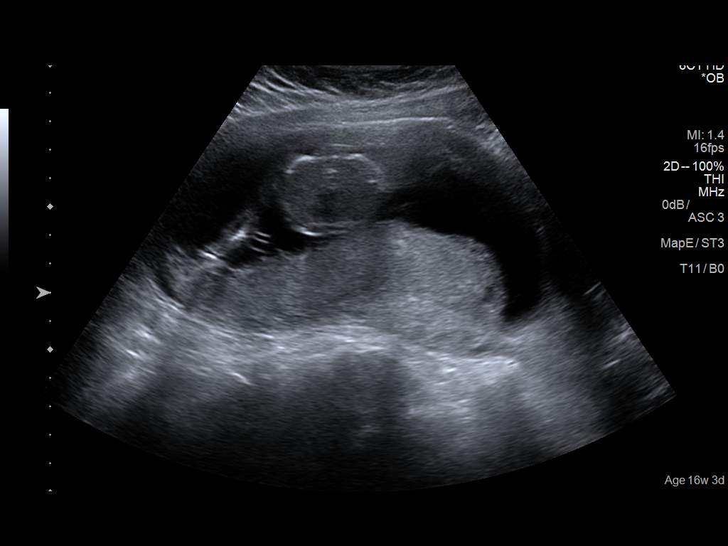
[im 13/51]
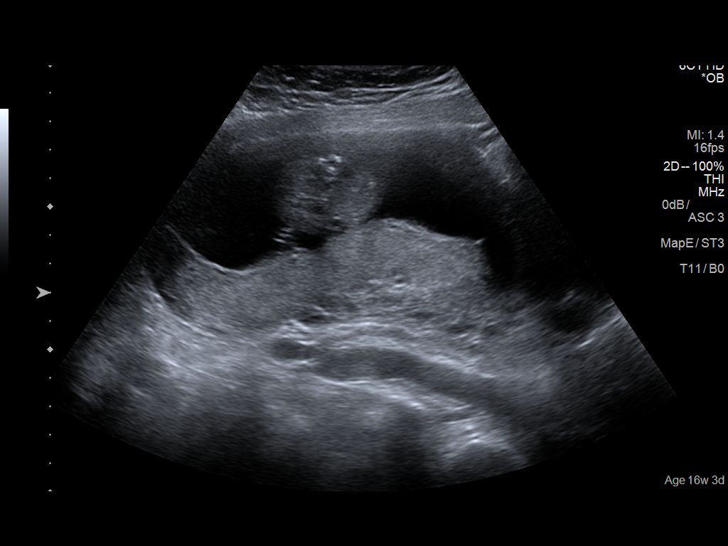
[im 17/51]
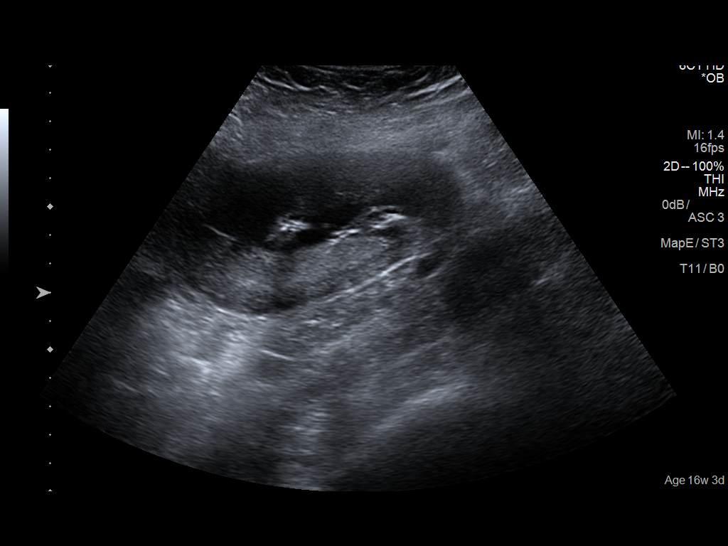
[im 21/51]
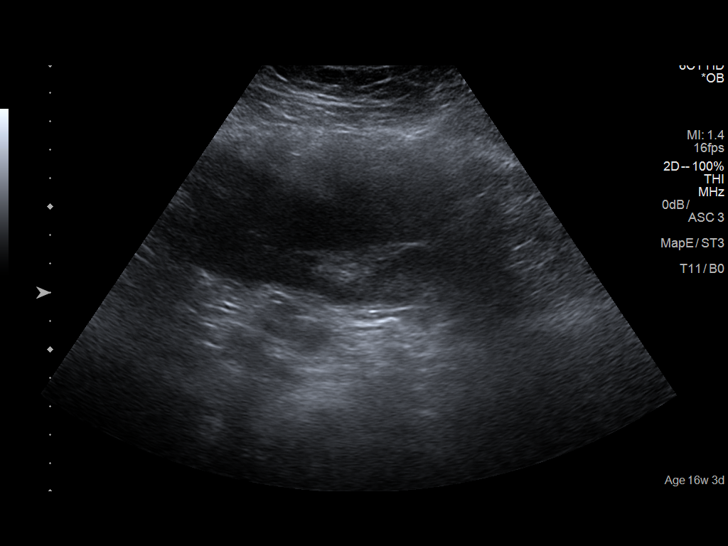
[im 25/51]
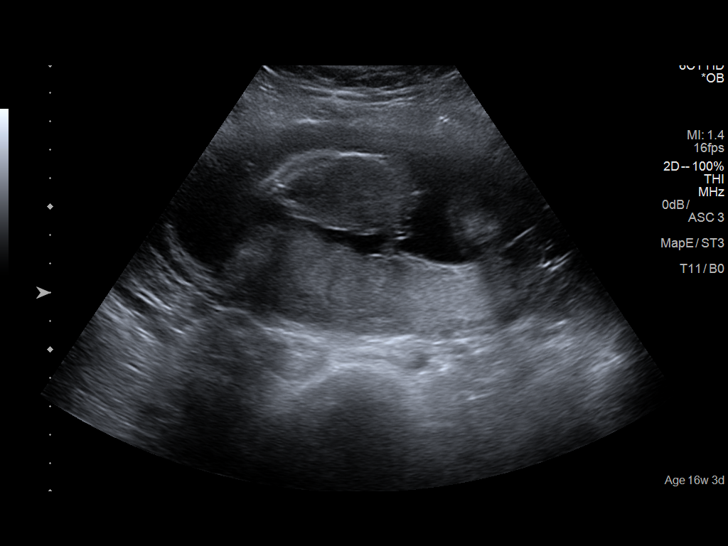
[im 28/51]
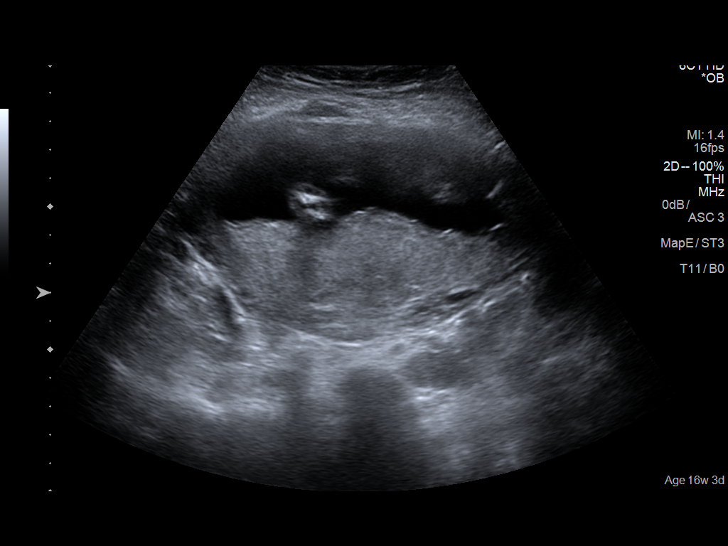
[im 32/51]
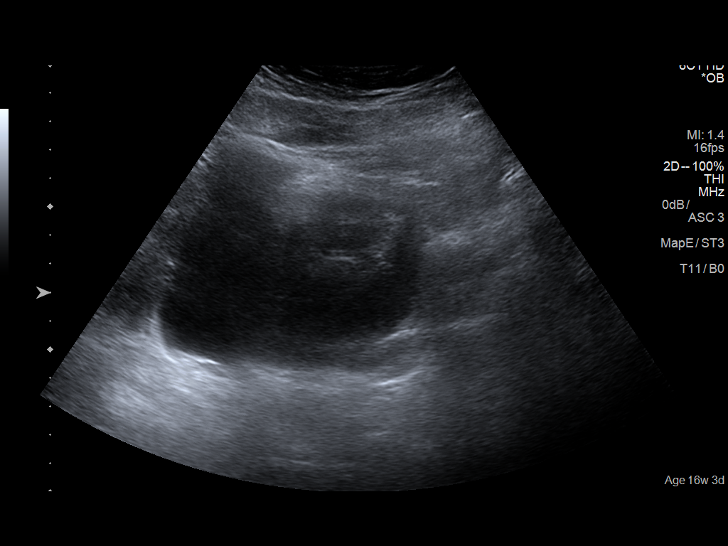
[im 36/51]
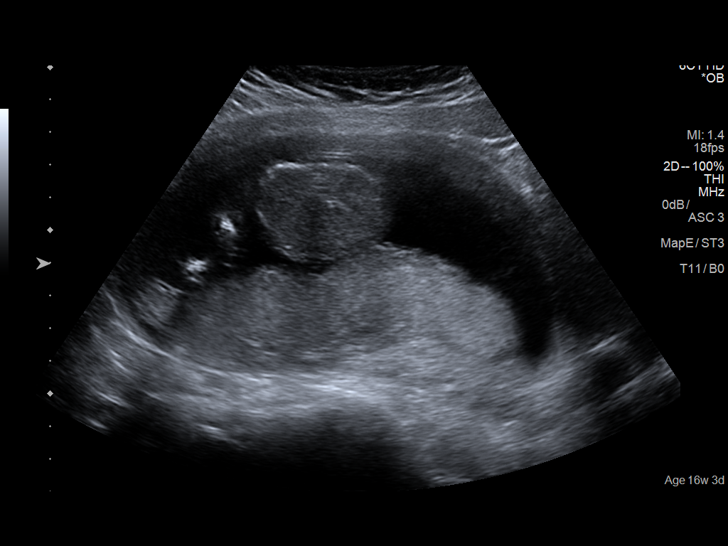
[im 39/51]
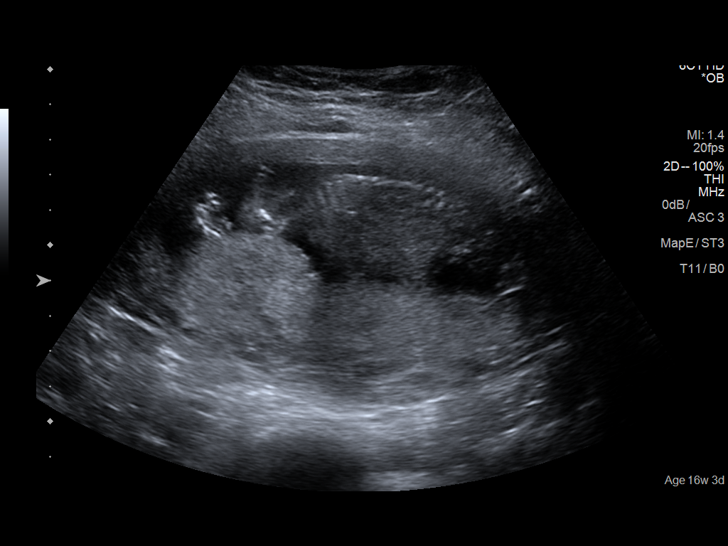
[im 43/51]
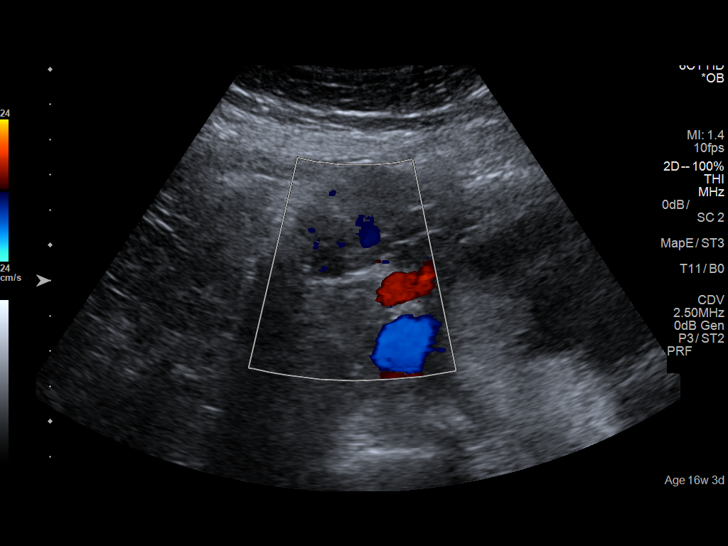
[im 47/51]
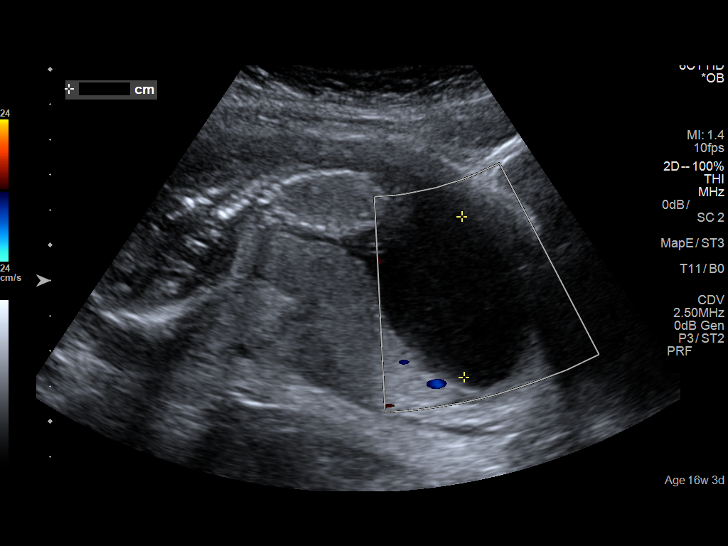
[im 51/51]
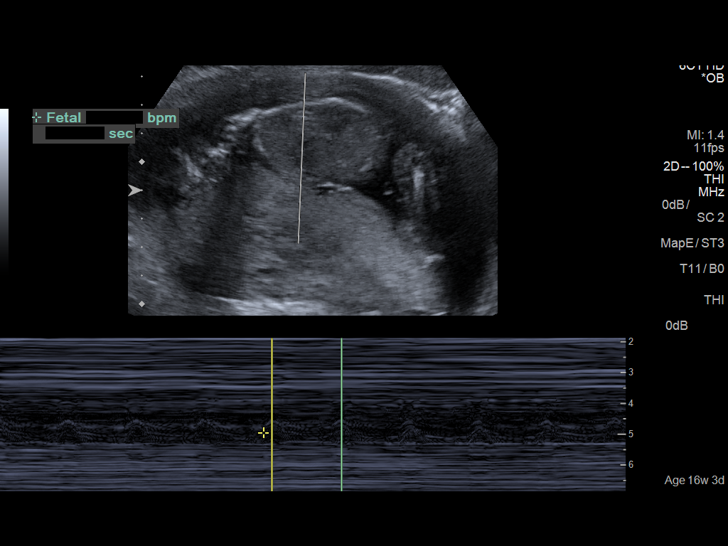

[14 of 28 positions shown; findings below may reference images not displayed]

FINDINGS: Number of Fetuses: 1

Heart Rate:  147 bpm

Movement: Yes

Presentation: Transverse

Placental Location: Primarily posterior

Previa: Marginal 1.3 cm from the cervical internal os.

Amniotic Fluid (Subjective):  Within normal limits.

BPD: 3.4 cm 16 w  3 d

MATERNAL FINDINGS:

Cervix:  Appears closed.  4.3 cm in length.

Uterus/Adnexae: No abnormality visualized.
IMPRESSION: 1. Single live intrauterine pregnancy with a measured gestational
age of 16 weeks and 3 days.
2. Placenta lies 1.3 cm from the internal os of the cervix,
marginal.
3. No other pregnancy abnormality.

This exam is performed on an emergent basis and does not
comprehensively evaluate fetal size, dating, or anatomy; follow-up
complete OB US should be considered if further fetal assessment is
warranted.

## 2020-03-20 ENCOUNTER — Other Ambulatory Visit: Payer: Self-pay

## 2020-03-20 ENCOUNTER — Observation Stay: Payer: Medicaid Other

## 2020-03-20 ENCOUNTER — Observation Stay
Admission: EM | Admit: 2020-03-20 | Discharge: 2020-03-21 | Disposition: A | Discharge status: Home / Self Care | Payer: Medicaid Other | Attending: Obstetrics and Gynecology | Admitting: Obstetrics and Gynecology

## 2020-03-20 DIAGNOSIS — E876 Hypokalemia: Secondary | ICD-10-CM | POA: Insufficient documentation

## 2020-03-20 DIAGNOSIS — Z79899 Other long term (current) drug therapy: Secondary | ICD-10-CM | POA: Insufficient documentation

## 2020-03-20 DIAGNOSIS — O26893 Other specified pregnancy related conditions, third trimester: Secondary | ICD-10-CM | POA: Diagnosis not present

## 2020-03-20 DIAGNOSIS — Z3A30 30 weeks gestation of pregnancy: Secondary | ICD-10-CM | POA: Insufficient documentation

## 2020-03-20 DIAGNOSIS — Z87891 Personal history of nicotine dependence: Secondary | ICD-10-CM | POA: Diagnosis not present

## 2020-03-20 DIAGNOSIS — O212 Late vomiting of pregnancy: Secondary | ICD-10-CM | POA: Diagnosis present

## 2020-03-20 DIAGNOSIS — O219 Vomiting of pregnancy, unspecified: Secondary | ICD-10-CM | POA: Diagnosis present

## 2020-03-20 LAB — URINALYSIS, COMPLETE (UACMP) WITH MICROSCOPIC
Bilirubin Urine: NEGATIVE
Glucose, UA: NEGATIVE mg/dL
Hgb urine dipstick: NEGATIVE
Ketones, ur: 80 mg/dL — AB
Nitrite: NEGATIVE
Protein, ur: 30 mg/dL — AB
Specific Gravity, Urine: 1.025 (ref 1.005–1.030)
pH: 5 (ref 5.0–8.0)

## 2020-03-20 LAB — CBC
HCT: 37.2 % (ref 36.0–46.0)
Hemoglobin: 13.3 g/dL (ref 12.0–15.0)
MCH: 32.5 pg (ref 26.0–34.0)
MCHC: 35.8 g/dL (ref 30.0–36.0)
MCV: 91 fL (ref 80.0–100.0)
Platelets: 240 10*3/uL (ref 150–400)
RBC: 4.09 MIL/uL (ref 3.87–5.11)
RDW: 11.9 % (ref 11.5–15.5)
WBC: 21 10*3/uL — ABNORMAL HIGH (ref 4.0–10.5)
nRBC: 0 % (ref 0.0–0.2)

## 2020-03-20 LAB — COMPREHENSIVE METABOLIC PANEL
ALT: 13 U/L (ref 0–44)
AST: 20 U/L (ref 15–41)
Albumin: 4.1 g/dL (ref 3.5–5.0)
Alkaline Phosphatase: 56 U/L (ref 38–126)
Anion gap: 14 (ref 5–15)
BUN: 11 mg/dL (ref 6–20)
CO2: 16 mmol/L — ABNORMAL LOW (ref 22–32)
Calcium: 8.9 mg/dL (ref 8.9–10.3)
Chloride: 105 mmol/L (ref 98–111)
Creatinine, Ser: 0.4 mg/dL — ABNORMAL LOW (ref 0.44–1.00)
GFR calc Af Amer: >60 mL/min (ref >60)
GFR calc non Af Amer: >60 mL/min (ref >60)
Glucose, Bld: 111 mg/dL — ABNORMAL HIGH (ref 70–99)
Potassium: 3.1 mmol/L — ABNORMAL LOW (ref 3.5–5.1)
Sodium: 135 mmol/L (ref 135–145)
Total Bilirubin: 0.9 mg/dL (ref 0.3–1.2)
Total Protein: 7.5 g/dL (ref 6.5–8.1)

## 2020-03-20 MED ORDER — ONDANSETRON HCL 4 MG/2ML IJ SOLN
4.0000 mg | Freq: Four times a day (QID) | INTRAMUSCULAR | Status: DC | PRN
  Administered 2020-03-20: 4 mg via INTRAVENOUS
  Filled 2020-03-20: qty 2

## 2020-03-20 MED ORDER — DEXTROSE IN LACTATED RINGERS 5 % IV SOLN: INTRAVENOUS | Status: DC

## 2020-03-20 MED ORDER — LACTATED RINGERS IV BOLUS
1000.0000 mL | Freq: Once | INTRAVENOUS | Status: AC
  Administered 2020-03-20: 1000 mL via INTRAVENOUS

## 2020-03-20 MED ORDER — FAMOTIDINE IN NACL 20-0.9 MG/50ML-% IV SOLN
20.0000 mg | Freq: Once | INTRAVENOUS | Status: AC
  Administered 2020-03-20: 20 mg via INTRAVENOUS
  Filled 2020-03-20 (×2): qty 50

## 2020-03-20 NOTE — Progress Notes (Signed)
[place] Tech @ bedside to perform abdominal US.

## 2020-03-20 NOTE — Progress Notes (Signed)
CNM in department and aware of patient arrival to unit. Orders given for IV and nausea medications with lab work.

## 2020-03-20 NOTE — OB Triage Note (Signed)
Pt is a 29yo G6P2 at [redacted]w[redacted]d that presents from ED qwityh c/o Nausea, vomiting, diarrhea that started around 1600 today. Pt states she has had 15 episodes of emesis and 2 episodes of watery diarrhea. Pt states the emesis is yellow. Pt also has a cough that started around the same time today. Pt denies LOF, VB and states "Contractions may be from throwing up." Vitals 124/71 and temp 97.6, resp 18. EFM applied and initial FHT 155.

## 2020-03-20 NOTE — Progress Notes (Signed)
Pt states she is "feeling much better now" and was able to tolerate oral hydration with no episodes of nausea/vomiting. Pt states "I'm just going to sleep now until I can be discharged home."

## 2020-03-21 DIAGNOSIS — O212 Late vomiting of pregnancy: Secondary | ICD-10-CM | POA: Diagnosis not present

## 2020-03-21 MED ORDER — ONDANSETRON 4 MG PO TBDP: 4.0000 mg | ORAL_TABLET | Freq: Four times a day (QID) | ORAL | Status: DC | PRN

## 2020-03-21 MED ORDER — FAMOTIDINE 20 MG PO TABS: 20.0000 mg | ORAL_TABLET | Freq: Two times a day (BID) | ORAL | Status: DC

## 2020-03-21 MED ORDER — SODIUM CHLORIDE 0.9 % IV SOLN
25.0000 mg | INTRAVENOUS | Status: AC
  Administered 2020-03-21: 25 mg via INTRAVENOUS
  Filled 2020-03-21: qty 1

## 2020-03-21 NOTE — Progress Notes (Signed)
RN @ bedside to evaluate patient. Pt was sleeping upon assessment. Pt states she has had no more nausea or vomiting since IV phenergan was started at 0116. Vitals WDL and pt denies any needs at this time.

## 2020-03-21 NOTE — Progress Notes (Signed)
Verbal order given to D/C patient home. When RN got to bedside pt stated "I feel so sick again. When can I get more Zofran." RN informed her she could not have anymore Zofran for a few more hours so pt requested "somehting else to bridge with the zofran?" RN informed CNM and order given for IVPB phenergan. IV medication started and patient resting.

## 2020-03-21 NOTE — ED Triage Notes (Signed)
Michelle Benton is a 29 y.o. female. She is at [redacted]w[redacted]d gestation. Patient's last menstrual period was 08/19/2019. Estimated Date of Delivery: 05/25/20  Prenatal care site: [facility]   Current pregnancy complicated by:  1. Elevated 1hr GTT, unable to tolerate 3hr GTT 2. ADHD, no current meds 3. Hx chronic low back pain- sees chiropractor 4. Hx colitis, pt concerned about possible H Pylori.   Chief complaint: Nausea, vomiting and diarrhea  Location: n/a Onset/timing: started around 5/14 at 1600 Duration: intermittent Quality: was feeling unwell on 5/13, then nausea and vomiting started 5/14 at 1600. Multiple episodes of vomiting and watery diarrhea. Midline pain at sternum and rises upwards, pt feels like acid is burning.  Severity: n/a Aggravating or alleviating conditions: Ate a hot dog at lunch time today. Has only been able to sip clear fluids and still vomiting.  Associated signs/symptoms: diarrhea several times this eve.  Context: hx Colitis approx 1 yr ago- seen at [facility]. Recently concerned about H Pylori and has had labs done with PCP. Receiving prenatal care at [facility]- unable to tolerate 3hr GTT and checking QID CBG at home.   S: Resting comfortably. no CTX, no VB.no LOF,  Active fetal movement.  Denies: HA, visual changes, SOB, or RUQ/epigastric pain  Maternal Medical History:   Past Medical History:  Diagnosis Date  . Anemia   . Asthma   . Premature ventricular contractions 2017    Past Surgical History:  Procedure Laterality Date  . NASAL SINUS SURGERY      No Known Allergies  Prior to Admission medications   Medication Sig Start Date End Date Taking? Authorizing Provider  Prenatal Vit-Fe Fumarate-FA (PRENATAL MULTIVITAMIN) TABS tablet Take 1 tablet by mouth daily at 12 noon.   Yes [provider]  ondansetron (ZOFRAN) 4 MG tablet Take 1 tablet (4 mg total) by mouth daily as needed for nausea or vomiting. Patient not taking: Reported on  03/20/2020 07/14/19 07/13/20  Concha Se, MD      Social History: She  reports that she quit smoking about 3 years ago. She has never used smokeless tobacco. She reports that she does not drink alcohol or use drugs.  Family History:  no history of gyn cancers  Review of Systems: A full review of systems was performed and negative except as noted in the HPI.     O:  BP (!) 100/51   Pulse 93   Temp 97.6 F (36.4 C) (Oral)   Resp 18   Ht 5\' 4"  (1.626 m)   Wt 78.9 kg   LMP 08/19/2019   BMI 29.87 kg/m  Results for orders placed or performed during the hospital encounter of 03/20/20 (from the past 48 hour(s))  Comprehensive metabolic panel   Collection Time: 03/20/20  8:54 PM  Result Value Ref Range   Sodium 135 135 - 145 mmol/L   Potassium 3.1 (L) 3.5 - 5.1 mmol/L   Chloride 105 98 - 111 mmol/L   CO2 16 (L) 22 - 32 mmol/L   Glucose, Bld 111 (H) 70 - 99 mg/dL   BUN 11 6 - 20 mg/dL   Creatinine, Ser 03/22/20 (L) 0.44 - 1.00 mg/dL   Calcium 8.9 8.9 - 1.61 mg/dL   Total Protein 7.5 6.5 - 8.1 g/dL   Albumin 4.1 3.5 - 5.0 g/dL   AST 20 15 - 41 U/L   ALT 13 0 - 44 U/L   Alkaline Phosphatase 56 38 - 126 U/L  Total Bilirubin 0.9 0.3 - 1.2 mg/dL   GFR calc non Af Amer >60 >60 mL/min   GFR calc Af Amer >60 >60 mL/min   Anion gap 14 5 - 15  CBC on admission   Collection Time: 03/20/20  8:54 PM  Result Value Ref Range   WBC 21.0 (H) 4.0 - 10.5 K/uL   RBC 4.09 3.87 - 5.11 MIL/uL   Hemoglobin 13.3 12.0 - 15.0 g/dL   HCT 37.2 36.0 - 46.0 %   MCV 91.0 80.0 - 100.0 fL   MCH 32.5 26.0 - 34.0 pg   MCHC 35.8 30.0 - 36.0 g/dL   RDW 11.9 11.5 - 15.5 %   Platelets 240 150 - 400 K/uL   nRBC 0.0 0.0 - 0.2 %  Urinalysis, Complete w Microscopic   Collection Time: 03/20/20  9:31 PM  Result Value Ref Range   Color, Urine AMBER (A) YELLOW   APPearance CLOUDY (A) CLEAR   Specific Gravity, Urine 1.025 1.005 - 1.030   pH 5.0 5.0 - 8.0   Glucose, UA NEGATIVE NEGATIVE mg/dL   Hgb urine dipstick  NEGATIVE NEGATIVE   Bilirubin Urine NEGATIVE NEGATIVE   Ketones, ur 80 (A) NEGATIVE mg/dL   Protein, ur 30 (A) NEGATIVE mg/dL   Nitrite NEGATIVE NEGATIVE   Leukocytes,Ua TRACE (A) NEGATIVE   RBC / HPF 0-5 0 - 5 RBC/hpf   WBC, UA 0-5 0 - 5 WBC/hpf   Bacteria, UA MANY (A) NONE SEEN   Squamous Epithelial / LPF 21-50 0 - 5   Mucus PRESENT      Constitutional: NAD, AAOx3  HE/ENT: extraocular movements grossly intact, moist mucous membranes CV: RRR PULM: nl respiratory effort, CTABL     Abd: gravid, non-distended, soft; epigastric tenderness at midline.       Ext: Non-tender, Nonedematous Neg CVAT bilaterally.    Psych: mood appropriate, speech normal Pelvic: deferred  Fetal  monitoring: Cat I Appropriate for GA Baseline: 150bpm Variability: moderate Accelerations: absent/ present x >2 Decelerations absent Time 41mins    A/P: 29 y.o. [redacted]w[redacted]d here for antenatal surveillance for N/V/D.   Principle Diagnosis:  30wks with N/V/D   Labor: not present.   Fetal Wellbeing: Reassuring Cat 1 tracing with Reactive NST   Plan to remain in house overnight for ongoing IV hydration.   Pt feeling better after IV Zofran, pepcid and Phenergan  Advance diet as tolerated.   Mild hypokalemia: Will order potassium replacement once able to tolerate clears or bland/full liquid diet.   Neg RUQ [place], CMP with slightly low K+, CBC with elevated WBCs; afebrile.   Initial UI resolved after IV bolus  Plan to transfer to postpartum for routine antepartum care.   Plan repeat labs in AM.   UA with vaginal contamination, no e/o pyelo.      Francetta Found, CNM 03/21/2020  2:11 AM

## 2020-03-21 NOTE — Discharge Summary (Signed)
Michelle Benton is a 29 y.o. female. She is at [redacted]w[redacted]d gestation. Patient's last menstrual period was 08/19/2019. Estimated Date of Delivery: 05/25/20  Prenatal care site: [facility]   Current pregnancy complicated by:  1. Elevated 1hr GTT, unable to tolerate 3hr GTT 2. ADHD, no current meds 3. Hx chronic low back pain- sees chiropractor 4. Hx colitis, pt concerned about possible H Pylori.   Chief complaint: Nausea, vomiting and diarrhea  Location: n/a Onset/timing: started around 5/14 at 1600 Duration: intermittent Quality: was feeling unwell on 5/13, then nausea and vomiting started 5/14 at 1600. Multiple episodes of vomiting and watery diarrhea. Midline pain at sternum and rises upwards, pt feels like acid is burning.  Severity: n/a Aggravating or alleviating conditions: Ate a hot dog at lunch time today. Has only been able to sip clear fluids and still vomiting. reports that her 40mos old child has had similar sx this week.  Associated signs/symptoms: diarrhea several times 5/15.  Context: hx Colitis approx 1 yr ago- seen at [facility] ER. Recently concerned about H Pylori and has had labs done with PCP. Receiving prenatal care at [facility]- unable to tolerate 3hr GTT and checking QID CBG at home.   S: Resting comfortably. no CTX, no VB.no LOF,  Active fetal movement. Denies: HA, visual changes, SOB, or RUQ/epigastric pain. Feeling much better overall after hydration and anti-emetics. Tolerating PO intake for breakfast and desires DC home.   Maternal Medical History:   Past Medical History:  Diagnosis Date  . Anemia   . Asthma   . Premature ventricular contractions 2017    Past Surgical History:  Procedure Laterality Date  . NASAL SINUS SURGERY      No Known Allergies  Prior to Admission medications   Medication Sig Start Date End Date Taking? Authorizing Provider  Prenatal Vit-Fe Fumarate-FA (PRENATAL MULTIVITAMIN) TABS tablet Take 1 tablet by mouth daily at 12 noon.    Yes [provider]  ondansetron (ZOFRAN) 4 MG tablet Take 1 tablet (4 mg total) by mouth daily as needed for nausea or vomiting. Patient not taking: Reported on 03/20/2020 07/14/19 07/13/20  Concha Se, MD      Social History: She  reports that she quit smoking about 3 years ago. She has never used smokeless tobacco. She reports that she does not drink alcohol or use drugs.  Family History:  no history of gyn cancers  Review of Systems: A full review of systems was performed and negative except as noted in the HPI.     O:  BP (!) 102/57 (BP Location: Right Arm)   Pulse 84   Temp 98.7 F (37.1 C) (Oral)   Resp 12   Ht 5\' 4"  (1.626 m)   Wt 78.9 kg   LMP 08/19/2019   BMI 29.87 kg/m  Results for orders placed or performed during the hospital encounter of 03/20/20 (from the past 48 hour(s))  Comprehensive metabolic panel   Collection Time: 03/20/20  8:54 PM  Result Value Ref Range   Sodium 135 135 - 145 mmol/L   Potassium 3.1 (L) 3.5 - 5.1 mmol/L   Chloride 105 98 - 111 mmol/L   CO2 16 (L) 22 - 32 mmol/L   Glucose, Bld 111 (H) 70 - 99 mg/dL   BUN 11 6 - 20 mg/dL   Creatinine, Ser 03/22/20 (L) 0.44 - 1.00 mg/dL   Calcium 8.9 8.9 - 5.36 mg/dL   Total Protein 7.5 6.5 - 8.1 g/dL  Albumin 4.1 3.5 - 5.0 g/dL   AST 20 15 - 41 U/L   ALT 13 0 - 44 U/L   Alkaline Phosphatase 56 38 - 126 U/L   Total Bilirubin 0.9 0.3 - 1.2 mg/dL   GFR calc non Af Amer >60 >60 mL/min   GFR calc Af Amer >60 >60 mL/min   Anion gap 14 5 - 15  CBC on admission   Collection Time: 03/20/20  8:54 PM  Result Value Ref Range   WBC 21.0 (H) 4.0 - 10.5 K/uL   RBC 4.09 3.87 - 5.11 MIL/uL   Hemoglobin 13.3 12.0 - 15.0 g/dL   HCT 37.2 36.0 - 46.0 %   MCV 91.0 80.0 - 100.0 fL   MCH 32.5 26.0 - 34.0 pg   MCHC 35.8 30.0 - 36.0 g/dL   RDW 11.9 11.5 - 15.5 %   Platelets 240 150 - 400 K/uL   nRBC 0.0 0.0 - 0.2 %  Urinalysis, Complete w Microscopic   Collection Time: 03/20/20  9:31 PM  Result Value Ref  Range   Color, Urine AMBER (A) YELLOW   APPearance CLOUDY (A) CLEAR   Specific Gravity, Urine 1.025 1.005 - 1.030   pH 5.0 5.0 - 8.0   Glucose, UA NEGATIVE NEGATIVE mg/dL   Hgb urine dipstick NEGATIVE NEGATIVE   Bilirubin Urine NEGATIVE NEGATIVE   Ketones, ur 80 (A) NEGATIVE mg/dL   Protein, ur 30 (A) NEGATIVE mg/dL   Nitrite NEGATIVE NEGATIVE   Leukocytes,Ua TRACE (A) NEGATIVE   RBC / HPF 0-5 0 - 5 RBC/hpf   WBC, UA 0-5 0 - 5 WBC/hpf   Bacteria, UA MANY (A) NONE SEEN   Squamous Epithelial / LPF 21-50 0 - 5   Mucus PRESENT      Constitutional: NAD, AAOx3  HE/ENT: extraocular movements grossly intact, moist mucous membranes CV: RRR PULM: nl respiratory effort, CTABL     Abd: gravid, non-tender, non-distended, soft      Ext: Non-tender, Nonedematous   Psych: mood appropriate, speech normal Pelvic: deferred  Fetal  monitoring: Cat 1 Appropriate for GA, reactive NST  Baseline: 150bpm Variability: moderate Accelerations: present x >2, 10*10bpm Decelerations absent Time 52mins No UCs noted.    A/P: 29 y.o. [redacted]w[redacted]d here for antenatal surveillance for N/V/D in preg  Principle Diagnosis: 30wks with N/V/D   Labor: not present.   Fetal Wellbeing: Reassuring Cat 1 tracing with Reactive NST     Pt feeling better after IV Zofran, pepcid and Phenergan  Tolerated regular diet this am without difficulty  Mild hypokalemia: encouraged potassium rich foods, no further diarrhea or vomiting for several hours.   Neg RUQ [place], CMP with slightly low K+, CBC with elevated WBCs; afebrile.   UA with vaginal contamination, no e/o pyelo.   Plan repeat labs at f/u appt for routine [facility] this week.   Home with rx pepcid BID, Zofran prn.   D/c home stable, precautions reviewed, follow-up as scheduled.    Francetta Found, CNM 03/21/2020  11:27 AM

## 2020-05-27 ENCOUNTER — Encounter: Payer: Self-pay | Admitting: Obstetrics and Gynecology

## 2020-05-27 ENCOUNTER — Inpatient Hospital Stay
Admission: EM | Admit: 2020-05-27 | Discharge: 2020-05-28 | DRG: 807 | Disposition: A | Discharge status: Home / Self Care | Payer: Medicaid Other | Attending: Obstetrics and Gynecology | Admitting: Obstetrics and Gynecology

## 2020-05-27 ENCOUNTER — Other Ambulatory Visit: Payer: Self-pay

## 2020-05-27 DIAGNOSIS — Z20822 Contact with and (suspected) exposure to covid-19: Secondary | ICD-10-CM | POA: Diagnosis present

## 2020-05-27 DIAGNOSIS — Z3A40 40 weeks gestation of pregnancy: Secondary | ICD-10-CM

## 2020-05-27 DIAGNOSIS — Z3490 Encounter for supervision of normal pregnancy, unspecified, unspecified trimester: Secondary | ICD-10-CM

## 2020-05-27 DIAGNOSIS — O9962 Diseases of the digestive system complicating childbirth: Secondary | ICD-10-CM | POA: Diagnosis present

## 2020-05-27 DIAGNOSIS — O26893 Other specified pregnancy related conditions, third trimester: Secondary | ICD-10-CM | POA: Diagnosis present

## 2020-05-27 DIAGNOSIS — K529 Noninfective gastroenteritis and colitis, unspecified: Secondary | ICD-10-CM | POA: Diagnosis present

## 2020-05-27 DIAGNOSIS — Z87891 Personal history of nicotine dependence: Secondary | ICD-10-CM | POA: Diagnosis not present

## 2020-05-27 LAB — CBC
HCT: 35.7 % — ABNORMAL LOW (ref 36.0–46.0)
Hemoglobin: 12.6 g/dL (ref 12.0–15.0)
MCH: 32.6 pg (ref 26.0–34.0)
MCHC: 35.3 g/dL (ref 30.0–36.0)
MCV: 92.2 fL (ref 80.0–100.0)
Platelets: 225 10*3/uL (ref 150–400)
RBC: 3.87 MIL/uL (ref 3.87–5.11)
RDW: 12.5 % (ref 11.5–15.5)
WBC: 13.8 10*3/uL — ABNORMAL HIGH (ref 4.0–10.5)
nRBC: 0 % (ref 0.0–0.2)

## 2020-05-27 LAB — TYPE AND SCREEN
ABO/RH(D): O POS
Antibody Screen: NEGATIVE

## 2020-05-27 LAB — SARS CORONAVIRUS 2 BY RT PCR (HOSPITAL ORDER, PERFORMED IN ~~LOC~~ HOSPITAL LAB): SARS Coronavirus 2: NEGATIVE

## 2020-05-27 MED ORDER — OXYCODONE-ACETAMINOPHEN 5-325 MG PO TABS: 1.0000 | ORAL_TABLET | ORAL | Status: DC | PRN

## 2020-05-27 MED ORDER — IBUPROFEN 600 MG PO TABS
ORAL_TABLET | ORAL | Status: AC
  Filled 2020-05-27: qty 1

## 2020-05-27 MED ORDER — SIMETHICONE 80 MG PO CHEW: 80.0000 mg | CHEWABLE_TABLET | ORAL | Status: DC | PRN

## 2020-05-27 MED ORDER — SODIUM CHLORIDE 0.9% FLUSH: 3.0000 mL | INTRAVENOUS | Status: DC | PRN

## 2020-05-27 MED ORDER — OXYCODONE HCL 5 MG PO TABS
5.0000 mg | ORAL_TABLET | ORAL | Status: DC | PRN
  Administered 2020-05-27 – 2020-05-28 (×4): 5 mg via ORAL
  Filled 2020-05-27 (×4): qty 1

## 2020-05-27 MED ORDER — MISOPROSTOL 200 MCG PO TABS
ORAL_TABLET | ORAL | Status: AC
  Filled 2020-05-27: qty 4

## 2020-05-27 MED ORDER — WITCH HAZEL-GLYCERIN EX PADS: 1.0000 "application " | MEDICATED_PAD | CUTANEOUS | Status: DC | PRN

## 2020-05-27 MED ORDER — ONDANSETRON HCL 4 MG PO TABS: 4.0000 mg | ORAL_TABLET | ORAL | Status: DC | PRN

## 2020-05-27 MED ORDER — COCONUT OIL OIL: 1.0000 "application " | TOPICAL_OIL | Status: DC | PRN

## 2020-05-27 MED ORDER — AMMONIA AROMATIC IN INHA
RESPIRATORY_TRACT | Status: AC
  Filled 2020-05-27: qty 10

## 2020-05-27 MED ORDER — OXYTOCIN BOLUS FROM INFUSION: 333.0000 mL | Freq: Once | INTRAVENOUS | Status: DC

## 2020-05-27 MED ORDER — OXYCODONE-ACETAMINOPHEN 5-325 MG PO TABS: 2.0000 | ORAL_TABLET | ORAL | Status: DC | PRN

## 2020-05-27 MED ORDER — OXYTOCIN-SODIUM CHLORIDE 30-0.9 UT/500ML-% IV SOLN
2.5000 [IU]/h | INTRAVENOUS | Status: DC
  Filled 2020-05-27: qty 500

## 2020-05-27 MED ORDER — LIDOCAINE HCL (PF) 1 % IJ SOLN
INTRAMUSCULAR | Status: AC
  Filled 2020-05-27: qty 30

## 2020-05-27 MED ORDER — IBUPROFEN 600 MG PO TABS
600.0000 mg | ORAL_TABLET | Freq: Four times a day (QID) | ORAL | Status: DC
  Administered 2020-05-27 – 2020-05-28 (×5): 600 mg via ORAL
  Filled 2020-05-27 (×4): qty 1

## 2020-05-27 MED ORDER — MEASLES, MUMPS & RUBELLA VAC IJ SOLR
0.5000 mL | Freq: Once | INTRAMUSCULAR | Status: DC
  Filled 2020-05-27: qty 0.5

## 2020-05-27 MED ORDER — DIBUCAINE (PERIANAL) 1 % EX OINT: 1.0000 "application " | TOPICAL_OINTMENT | CUTANEOUS | Status: DC | PRN

## 2020-05-27 MED ORDER — FLEET ENEMA 7-19 GM/118ML RE ENEM: 1.0000 | ENEMA | Freq: Every day | RECTAL | Status: DC | PRN

## 2020-05-27 MED ORDER — ZOLPIDEM TARTRATE 5 MG PO TABS: 5.0000 mg | ORAL_TABLET | Freq: Every evening | ORAL | Status: DC | PRN

## 2020-05-27 MED ORDER — LACTATED RINGERS IV SOLN: INTRAVENOUS | Status: DC

## 2020-05-27 MED ORDER — LACTATED RINGERS IV SOLN: 500.0000 mL | INTRAVENOUS | Status: DC | PRN

## 2020-05-27 MED ORDER — ONDANSETRON HCL 4 MG/2ML IJ SOLN: 4.0000 mg | INTRAMUSCULAR | Status: DC | PRN

## 2020-05-27 MED ORDER — SODIUM CHLORIDE 0.9% FLUSH: 3.0000 mL | Freq: Two times a day (BID) | INTRAVENOUS | Status: DC

## 2020-05-27 MED ORDER — BISACODYL 10 MG RE SUPP: 10.0000 mg | Freq: Every day | RECTAL | Status: DC | PRN

## 2020-05-27 MED ORDER — SENNOSIDES-DOCUSATE SODIUM 8.6-50 MG PO TABS
2.0000 | ORAL_TABLET | ORAL | Status: DC
  Administered 2020-05-27: 2 via ORAL
  Filled 2020-05-27: qty 2

## 2020-05-27 MED ORDER — BUTORPHANOL TARTRATE 1 MG/ML IJ SOLN: 1.0000 mg | INTRAMUSCULAR | Status: DC | PRN

## 2020-05-27 MED ORDER — PRENATAL MULTIVITAMIN CH
1.0000 | ORAL_TABLET | Freq: Every day | ORAL | Status: DC
  Administered 2020-05-27: 1 via ORAL
  Filled 2020-05-27: qty 1

## 2020-05-27 MED ORDER — DIPHENHYDRAMINE HCL 25 MG PO CAPS: 25.0000 mg | ORAL_CAPSULE | Freq: Four times a day (QID) | ORAL | Status: DC | PRN

## 2020-05-27 MED ORDER — ACETAMINOPHEN 325 MG PO TABS
650.0000 mg | ORAL_TABLET | ORAL | Status: DC | PRN
  Administered 2020-05-27 – 2020-05-28 (×5): 650 mg via ORAL
  Filled 2020-05-27 (×5): qty 2

## 2020-05-27 MED ORDER — ONDANSETRON HCL 4 MG/2ML IJ SOLN
4.0000 mg | Freq: Four times a day (QID) | INTRAMUSCULAR | Status: DC | PRN
  Administered 2020-05-27: 4 mg via INTRAVENOUS
  Filled 2020-05-27: qty 2

## 2020-05-27 MED ORDER — BENZOCAINE-MENTHOL 20-0.5 % EX AERO: 1.0000 "application " | INHALATION_SPRAY | CUTANEOUS | Status: DC | PRN

## 2020-05-27 MED ORDER — ACETAMINOPHEN 325 MG PO TABS: 650.0000 mg | ORAL_TABLET | ORAL | Status: DC | PRN

## 2020-05-27 MED ORDER — LIDOCAINE HCL (PF) 1 % IJ SOLN: 30.0000 mL | INTRAMUSCULAR | Status: DC | PRN

## 2020-05-27 MED ORDER — SODIUM CHLORIDE 0.9 % IV SOLN: 250.0000 mL | INTRAVENOUS | Status: DC | PRN

## 2020-05-27 MED ORDER — TETANUS-DIPHTH-ACELL PERTUSSIS 5-2.5-18.5 LF-MCG/0.5 IM SUSP: 0.5000 mL | Freq: Once | INTRAMUSCULAR | Status: DC

## 2020-05-27 MED ORDER — SOD CITRATE-CITRIC ACID 500-334 MG/5ML PO SOLN: 30.0000 mL | ORAL | Status: DC | PRN

## 2020-05-27 MED ORDER — OXYTOCIN 10 UNIT/ML IJ SOLN
INTRAMUSCULAR | Status: AC
  Administered 2020-05-27: 10 [IU] via INTRAMUSCULAR
  Filled 2020-05-27: qty 2

## 2020-05-27 NOTE — Progress Notes (Signed)
Post Partum Day 0  Subjective: no complaints, up ad lib, voiding and tolerating PO  Doing well, no concerns. Ambulating without difficulty, pain managed with PO meds, tolerating regular diet, and voiding without difficulty.   No fever/chills, chest pain, shortness of breath, nausea/vomiting, or leg pain. No nipple or breast pain. No headache, visual changes, or RUQ/epigastric pain.  Objective: BP 103/62 (BP Location: Right Arm)    Pulse 60    Temp 98.1 F (36.7 C) (Oral)    Resp 18    Ht 5\' 4"  (1.626 m)    Wt 83 kg    LMP 08/19/2019    SpO2 97% Comment: Room Air   Breastfeeding Unknown    BMI 31.41 kg/m    Physical Exam:  General: alert, cooperative and no distress Breasts: soft/nontender CV: RRR Pulm: nl effort, CTABL Abdomen: soft, non-tender, active bowel sounds Uterine Fundus: firm Perineum: minimal edema, lacerations hemostatic Lochia: appropriate DVT Evaluation: No evidence of DVT seen on physical exam.  Recent Labs    05/27/20 0256  HGB 12.6  HCT 35.7*  WBC 13.8*  PLT 225    Assessment/Plan: 29 y.o. 05/29/20 postpartum day # 0  -Continue routine postpartum care -Lactation consult PRN for breastfeeding   -Discussed contraceptive options including implant, IUDs hormonal and non-hormonal, injection, pills/ring/patch, condoms, and NFP. Planning Mirena IUD.  -CBC in AM  Disposition: Continue inpatient postpartum care - desires discharge tomorrow morning.   LOS: 0 days   [id number], Gustavo Lah 05/27/2020, 8:49 AM   ----- 05/29/2020  Certified Nurse Midwife [facility]

## 2020-05-27 NOTE — H&P (Signed)
OB ADMISSION/ HISTORY & PHYSICAL:  Admission Date: 05/27/2020 12:34 AM  Admit Diagnosis: Labor at term  Michelle Benton is a 29 y.o. [id number] presenting for contractions.  Prenatal History: [id number]   EDC : 05/25/2020, by Last Menstrual Period, 39+5  Prenatal care at [facility] Prenatal course complicated by  - ADHD -Colitis in pregnancy -low back pain -tapeworm in pregnancy  Medical / Surgical History :  Past medical history:  Past Medical History:  Diagnosis Date  . Anemia   . Asthma   . Premature ventricular contractions 2017     Past surgical history:  Past Surgical History:  Procedure Laterality Date  . NASAL SINUS SURGERY      Family History: No family history on file.   Social History:  reports that she quit smoking about 3 years ago. She has never used smokeless tobacco. She reports that she does not drink alcohol and does not use drugs.   Allergies: Patient has no known allergies.    Current Medications at time of admission:  Prior to Admission medications   Medication Sig Start Date End Date Taking? Authorizing Provider  Prenatal Vit-Fe Fumarate-FA (PRENATAL MULTIVITAMIN) TABS tablet Take 1 tablet by mouth daily at 12 noon.   Yes [provider]  ondansetron (ZOFRAN) 4 MG tablet Take 1 tablet (4 mg total) by mouth daily as needed for nausea or vomiting. Patient not taking: Reported on 03/20/2020 07/14/19 07/13/20  Concha Se, MD     Review of Systems: Active FM LOF  / SROM: no bloody show yes   Physical Exam:  VS: Blood pressure 109/79, pulse (!) 55, temperature 97.8 F (36.6 C), temperature source Axillary, resp. rate 16, height 5\' 4"  (1.626 m), weight 83 kg, last menstrual period 08/19/2019, unknown if currently breastfeeding.  General: alert and oriented, appears NAD Heart: RRR Lungs: Clear lung fields Abdomen: Gravid, soft and non-tender, non-distended Extremities: trace edema  FHT: 120, moderate variability, +accels, no decels TOCO:  2-65min SVE:  Dilation: 2.5 / Effacement (%): 60 / Station: -1    Cephalic by leopolds  Prenatal Labs: Blood type/Rh O positive  Antibody screen neg  Rubella Immune  Varicella Immune  RPR NR  HBsAg Neg  HIV NR  GC neg  Chlamydia neg  Genetic screening negative  1 hour GTT 145  3 hour GTT No results  GBS negative   No results found.  Assessment: [redacted]w[redacted]d weeks gestation 1 stage of labor FHR category 1   Plan:   Admit for active labor Labs pending Epidural if desired Continuous fetal monitoring   1. Fetal Well being  - Fetal Tracing: Cat I - Ultrasound:  reviewed, as above - Group B Streptococcus: neg - Presentation: vtx confirmed by Leopolds   2. Routine OB: - Prenatal labs reviewed, as above - Rh O pos  3. Post Partum Planning: - Infant feeding: breast - Contraception: TBD  Plans to decline Hepatitis B vaccine for the baby, declined erythromycin, accepts Vit K.

## 2020-05-27 NOTE — Discharge Summary (Signed)
Obstetrical Discharge Summary  Patient Name: Michelle Benton DOB: 03/24/1991 MRN: [id number]  Date of Admission: 05/27/2020 Date of Discharge: 05/28/2020  Primary OB: [facility] Gestational Age at Delivery: [redacted]w[redacted]d   Antepartum complications:  - ADHD -Colitis in pregnancy -low back pain -tapeworm in pregnancy  Admitting Diagnosis: active labor Secondary Diagnosis: Patient Active Problem List   Diagnosis Date Noted  . Pregnancy 05/27/2020  . Indication for care in labor or delivery 03/21/2020  . Nausea and vomiting during pregnancy 03/20/2020  . Post-term pregnancy, 40-42 weeks of gestation 06/28/2018  . Labor and delivery, indication for care 11/27/2016  . First trimester screening 05/30/2016  . 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  Patient had an uncomplicated postpartum course.  By time of discharge on PPD#1, her pain was controlled on oral pain medications; she had appropriate lochia and was ambulating, voiding without difficulty and tolerating regular diet.  She was deemed stable for discharge to home.    Discharge Physical Exam:  BP 104/90   Pulse (!) 55   Temp 97.8 F (36.6 C) (Oral)   Resp 18   Ht 5\' 4"  (1.626 m)   Wt 83 kg   LMP 08/19/2019   SpO2 100%   Breastfeeding Unknown   BMI 31.41 kg/m   General: NAD CV: RRR Pulm: CTABL, nl  effort ABD: s/nd/nt, fundus firm and below the umbilicus Lochia: moderate DVT Evaluation: LE non-ttp, no evidence of DVT on exam.  Hemoglobin  Date Value Ref Range Status  05/28/2020 10.9 (L) 12.0 - 15.0 g/dL Final   HCT  Date Value Ref Range Status  05/28/2020 31.8 (L) 36 - 46 % Final   Postpartum Procedures: none  Edinburgh:  Edinburgh Postnatal Depression Scale Screening Tool 05/27/2020  I have been able to laugh and see the funny side of things. 0  I have looked forward with enjoyment to things. 0  I have blamed myself unnecessarily when things went wrong. 1  I have been anxious or worried for no good reason. 0  I have felt scared or panicky for no good reason. 0  Things have been getting on top of me. 1  I have been so unhappy that I have had difficulty sleeping. 0  I have felt sad or miserable. 0  I have been so unhappy that I have been crying. 0  The thought of harming myself has occurred to me. 0  Edinburgh Postnatal Depression Scale Total 2    Disposition: stable, discharge to home. Baby Feeding: breastmilk Baby Disposition: home with mom  Rh Immune globulin given: n/a Rubella vaccine given: n/a  Flu vaccine given in AP or PP setting: declined Tdap vaccine given in AP or PP setting: declined   Contraception: TBD  Prenatal Labs: Blood type/Rh --/--/O POS (07/21 0540)  Antibody screen neg  Rubella Immune  Varicella Immune  RPR NR  HBsAg Neg  HIV NR  GC neg  Chlamydia neg  Genetic screening negative  1 hour GTT 145  3 hour GTT declined  GBS neg     Plan:  Michelle Benton was discharged to home in good condition. Follow-up appointment at [facility] with delivering provider in 6 weeks   Discharge Medications: Allergies as of 05/28/2020   No Known Allergies     Medication List    STOP taking these medications   ondansetron 4 MG tablet Commonly known as: Zofran     TAKE these medications   ibuprofen 600 MG tablet Commonly known  as: ADVIL Take 1 tablet (600 mg total) by mouth every 6 (six) hours as needed for mild pain, moderate pain or cramping.   prenatal multivitamin Tabs tablet Take 1 tablet by mouth daily at 12 noon.        Follow-up Information    Christeen Douglas, MD. Schedule an appointment as soon as possible for a visit in 6 week(s).   Specialty: Obstetrics and Gynecology Why: postpartum visit.  Please let office know that you would like a Mirena IUD placed during your visit.  Contact information: [address] [place] [address] [phone]               Signed: Genia Del, CNM 05/28/2020 7:58 AM

## 2020-05-27 NOTE — OB Triage Note (Signed)
Pt arrival to triage with c/o contractions most of the day, worsening at night, now 5-10 min apart.  Denies LOF and VB, and is feeling baby move normally.  EFM and toco applied and assessing.

## 2020-05-27 NOTE — Progress Notes (Signed)
Discussed with pt the option to rule out labor vs. Admit now to L&D.  Pt desires cervical exam recheck in 1hr in order to guide decision making regarding admission decision.

## 2020-05-27 NOTE — Lactation Note (Signed)
This note was copied from a baby's chart. Lactation Consultation Note  Patient Name: Michelle Benton [id number] Date: 05/27/2020     Mom with previous breastfeeding experiences relayed to RN that she is comfortable and confident in breastfeeding and will reach out for help if it is needed.   Danford Bad 05/27/2020, 4:35 PM

## 2020-05-28 LAB — CBC
HCT: 31.8 % — ABNORMAL LOW (ref 36.0–46.0)
Hemoglobin: 10.9 g/dL — ABNORMAL LOW (ref 12.0–15.0)
MCH: 32.5 pg (ref 26.0–34.0)
MCHC: 34.3 g/dL (ref 30.0–36.0)
MCV: 94.9 fL (ref 80.0–100.0)
Platelets: 198 10*3/uL (ref 150–400)
RBC: 3.35 MIL/uL — ABNORMAL LOW (ref 3.87–5.11)
RDW: 12.5 % (ref 11.5–15.5)
WBC: 12.6 10*3/uL — ABNORMAL HIGH (ref 4.0–10.5)
nRBC: 0 % (ref 0.0–0.2)

## 2020-05-28 LAB — RPR: RPR Ser Ql: NONREACTIVE

## 2020-05-28 MED ORDER — IBUPROFEN 600 MG PO TABS: 600.0000 mg | ORAL_TABLET | Freq: Four times a day (QID) | ORAL | 0 refills | Status: DC | PRN

## 2020-05-28 NOTE — Progress Notes (Signed)
DC inst given pt verb u/o of care of self at home.

## 2020-05-28 NOTE — Progress Notes (Signed)
DC to home with NB.  To car via WC with auxillary.

## 2020-05-28 NOTE — Discharge Instructions (Signed)
After Your Delivery Discharge Instructions   Postpartum: Care Instructions  After childbirth (postpartum period), your body goes through many changes. Some of these changes happen over several weeks. In the hours after delivery, your body will begin to recover from childbirth while it prepares to breastfeed your newborn. You may feel emotional during this time. Your hormones can shift your mood without warning for no clear reason.  In the first couple of weeks after childbirth, many women have emotions that change from happy to sad. You may find it hard to sleep. You may cry a lot. This is called the "baby blues." These overwhelming emotions often go away within a couple of days or weeks. But it's important to discuss your feelings with your doctor.  You should call your care provider if you have unrelieved feelings of:  Inability to cope  Sadness  Anxiety  Lack of interest in baby  Insomnia  Crying  It is easy to get too tired and overwhelmed during the first weeks after childbirth. Don't try to do too much. Get rest whenever you can, accept help from others, and eat well and drink plenty of fluids.  About 4 to 6 weeks after your baby's birth, you will have a follow-up visit with your care provider. This visit is your time to talk to your provider about anything you are concerned or curious about.  Follow-up care is a key part of your treatment and safety. Be sure to make and go to all appointments, and call your doctor if you are having problems. It's also a good idea to know your test results and keep a list of the medicines you take.  How can you care for yourself at home?  Sleep or rest when your baby sleeps.  Get help with household chores from family or friends, if you can. Do not try to do it all yourself.  If you have hemorrhoids or swelling or pain around the opening of your vagina, try using cold and heat. You can put ice or a cold pack on the area for 10 to 20 minutes at  a time. Put a thin cloth between the ice and your skin. Also try sitting in a few inches of warm water (sitz bath) 3 times a day and after bowel movements.  Take pain medicines exactly as directed.  If the provider gave you a prescription medicine for pain, take it as prescribed.  If you do not have a prescription and need something over the counter, you can take:  Ibuprofen (Motrin, Advil) up to 600mg every 6 hours as needed for pain  Acetaminophen (Tylenol) up to 650mg every 4 hours as needed for pain  Some people find it helpful to alternate between these two medications.   No driving for 1-2 weeks or while taking pain medications.   Eat more fiber to avoid constipation. Include foods such as whole-grain breads and cereals, raw vegetables, raw and dried fruits, and beans.  Drink plenty of fluids, enough so that your urine is light yellow or clear like water. If you have kidney, heart, or liver disease and have to limit fluids, talk with your doctor before you increase the amount of fluids you drink.  Do not put anything in the vagina for 6 weeks. This means no sex, no tampons, no douching, and no enemas.  If you have stitches, keep the area clean by pouring or spraying warm water over the area outside your vagina and anus after you use the toilet.    No strenuous activity or heavy lifting for 6 weeks   No tub baths; showers only  Continue prenatal vitamin and iron.  If breastfeeding:  Increase calories and fluids while breastfeeding.  You may have a slight fever when your milk comes in, but it should go away on its own. If it does not, and rises above 101.0 please call the doctor.  For breastfeeding concerns, the lactation consultant can be reached at [phone].  For concerns about your baby, please call your pediatrician.   Keep a list of questions to bring to your postpartum visit. Your questions might be about:  Changes in your breasts, such as lumps or  soreness.  When to expect your menstrual period to start again.  What form of birth control is best for you.  Weight you have put on during the pregnancy.  Exercise options.  What foods and drinks are best for you, especially if you are breastfeeding.  Problems you might be having with breastfeeding.  When you can have sex. Some women may want to talk about lubricants for the vagina.  Any feelings of sadness or restlessness that you are having.   When should you call for help?  Call 911 anytime you think you may need emergency care. For example, call if:  You have thoughts of harming yourself, your baby, or another person.  You passed out (lost consciousness).  Call the office at [phone] or seek immediate medical care if:  If you have heavy bleeding such that you are soaking 1 pad in an hour for 2 hours  You are dizzy or lightheaded, or you feel like you may faint.  You have a fever; a temperature of 101.0 F or greater  Chills  Difficulty urinating  Headache unrelieved by "pain meds"   Visual changes  Pain in the right side of your belly near your ribs  Breasts reddened, hard, hot to the touch or any other breast concerns  Nipple discharge which is foul-smelling or contains pus   New pain unrelieved with recommended over-the-counter dosages  Difficulty breathing with or without chest pain   New leg pain, swelling, or redness, especially if it is only on one leg  Any other concerns  Watch closely for changes in your health, and be sure to contact your provider if:  You have new or worse vaginal discharge.  You feel sad or depressed.  You are having problems with your breasts or breastfeeding.       Sitz Bath A sitz bath is a warm water bath taken in the sitting position. The water covers only the hips and butt (buttocks). We recommend using one that fits in the toilet, to help with ease of use and cleanliness. It may be used for either  healing or cleaning purposes. Sitz baths are also used to relieve pain, itching, or muscle tightening (spasms). The water may contain medicine. Moist heat will help you heal and relax.  HOME CARE  Take 3 to 4 sitz baths a day. 1. Fill the bathtub half-full with warm water. 2. Sit in the water and open the drain a little. 3. Turn on the warm water to keep the tub half-full. Keep the water running constantly. 4. Soak in the water for 15 to 20 minutes. 5. After the sitz bath, pat the affected area dry. GET HELP RIGHT AWAY IF: You get worse instead of better. Stop the sitz baths if you get worse. MAKE SURE YOU:  Understand these instructions.  Will watch   Will get help right away if you are not doing well or get worse. Document Released: 12/01/2004 Document Revised: 07/18/2012 Document Reviewed: 02/21/2011 [facility] Patient Information 2015 [place], [place]. This information is not intended to replace advice given to you by your health care provider. Make sure you discuss any questions you have with your health care provider.

## 2020-09-18 IMAGING — US US ABDOMEN LIMITED
1 series · 14 of 25 positions shown · non-contrast
Comparison: CT 07/14/2019

CLINICAL DATA: Nausea and vomiting pregnancy

EXAM:
ULTRASOUND ABDOMEN LIMITED RIGHT UPPER QUADRANT

[Series 1: us abdomen limited ruq · 14 of 53 slices shown]
[im 1/53]
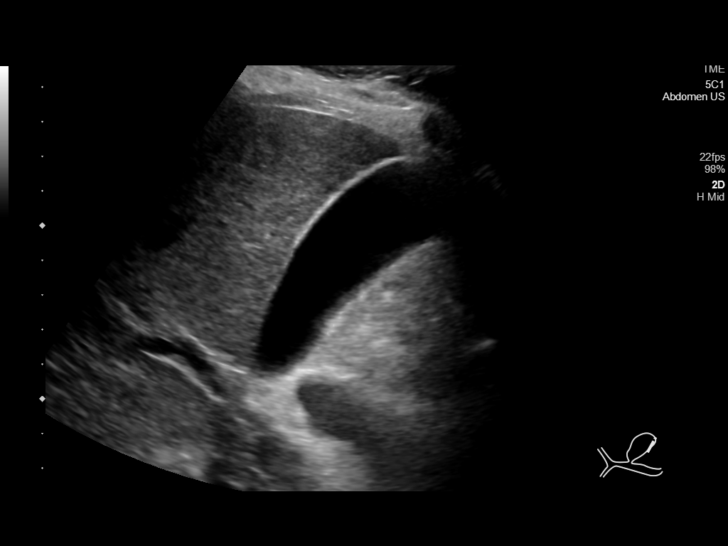
[im 5/53]
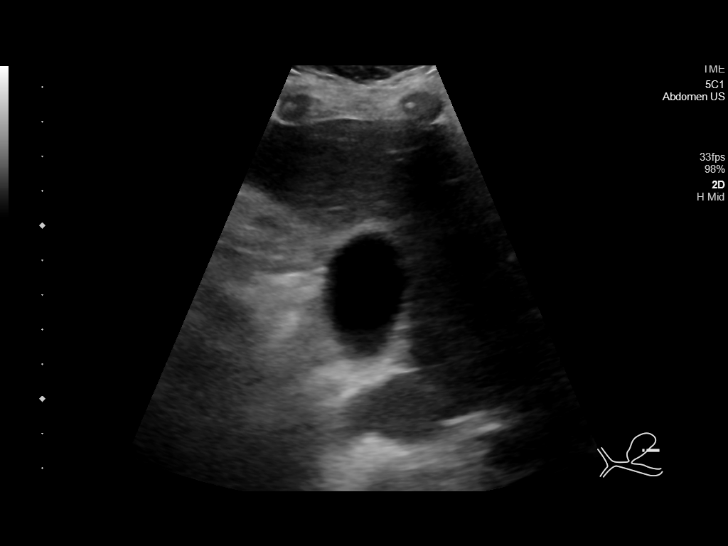
[im 9/53]
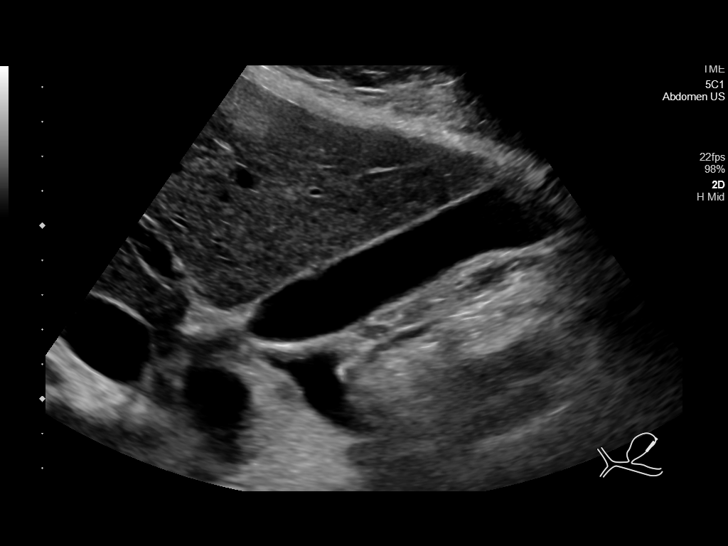
[im 14/53]
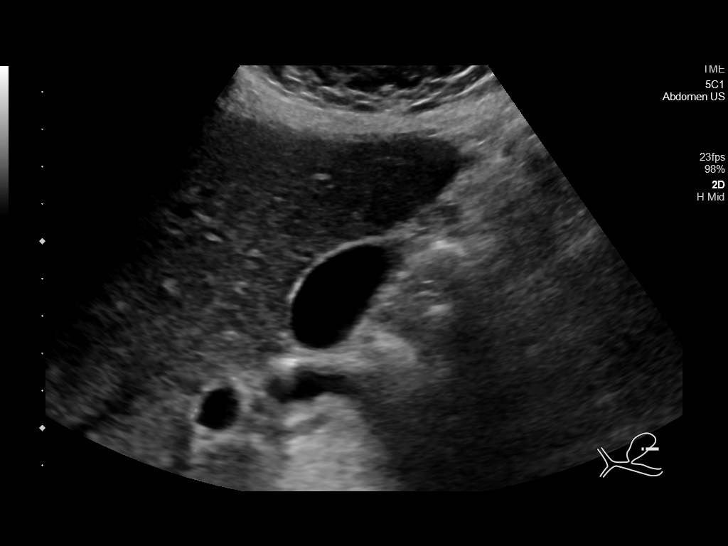
[im 18/53]
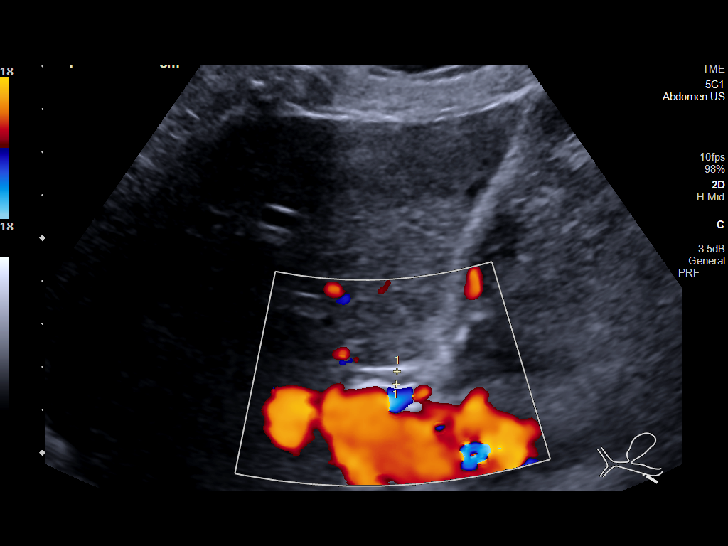
[im 20/53]
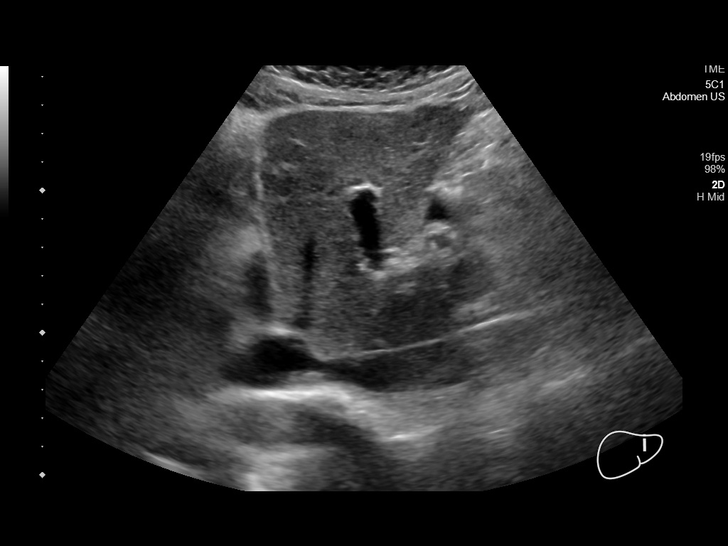
[im 24/53]
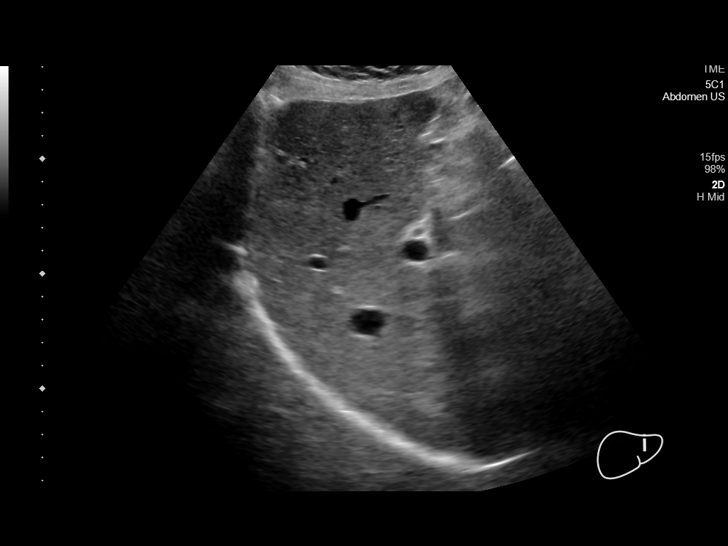
[im 29/53]
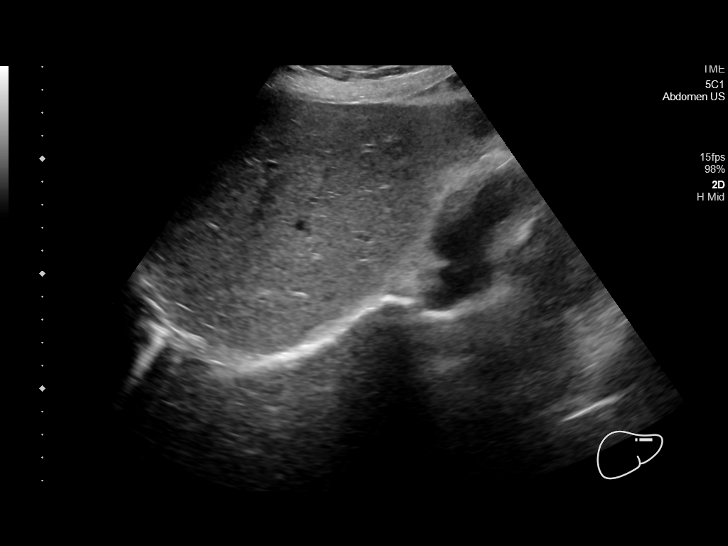
[im 33/53]
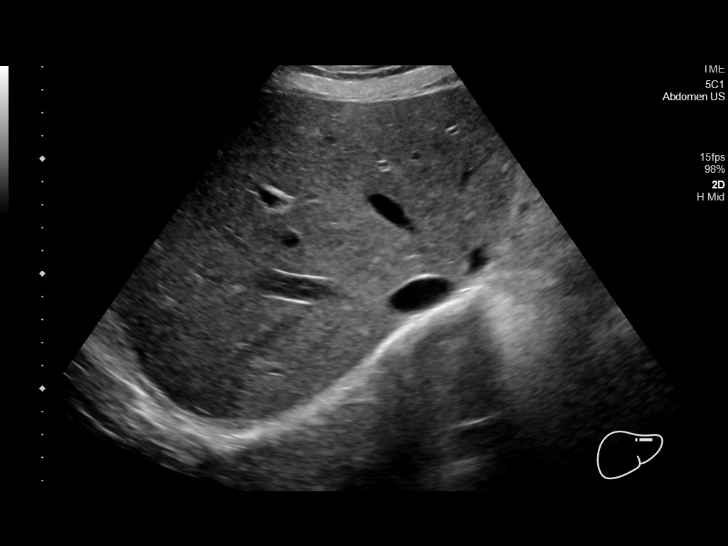
[im 35/53]
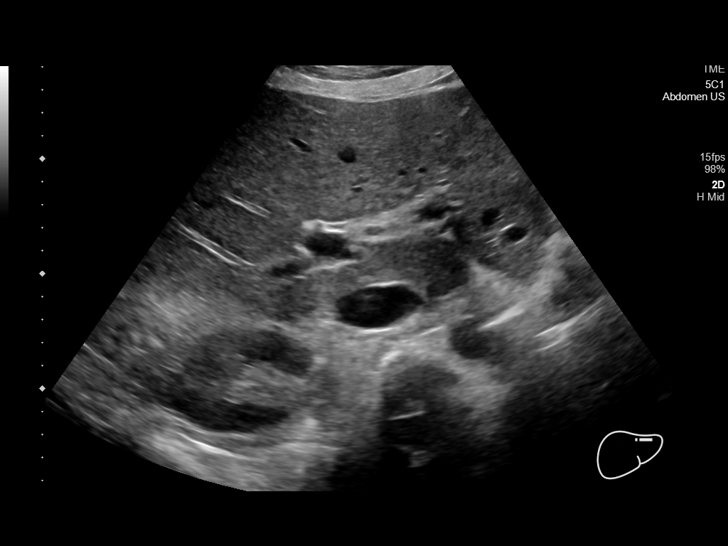
[im 40/53]
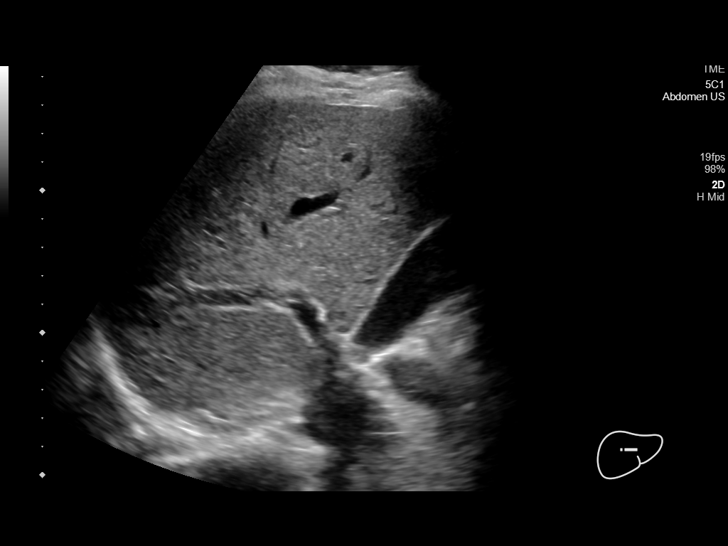
[im 44/53]
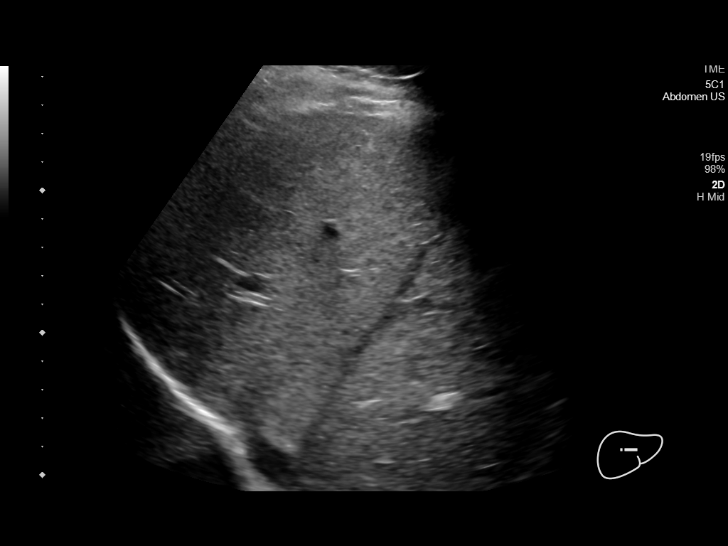
[im 48/53]
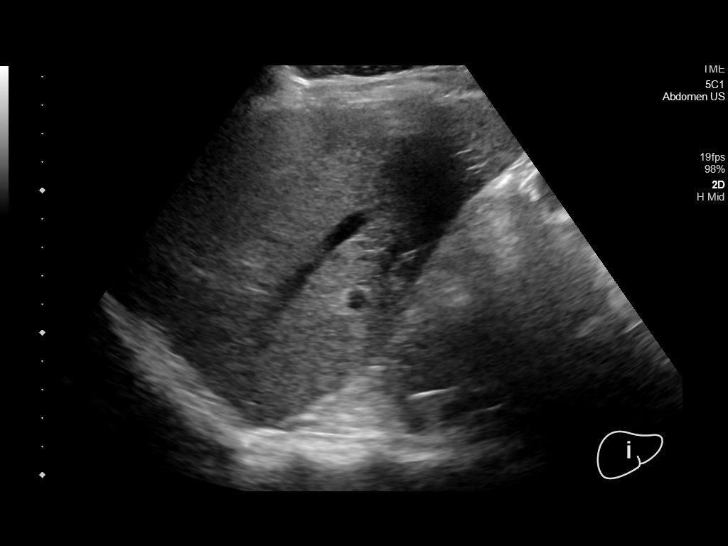
[im 53/53]
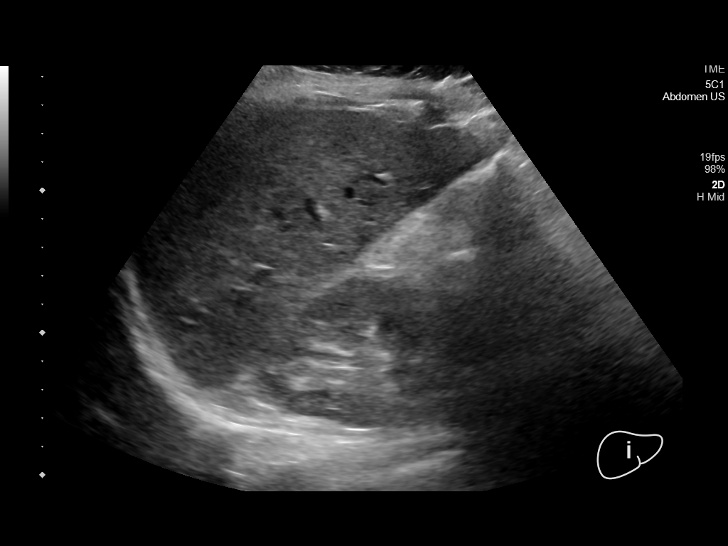

[14 of 25 positions shown; findings below may reference images not displayed]

FINDINGS: Gallbladder:

No gallstones or wall thickening visualized. No sonographic Murphy
sign noted by sonographer.

Common bile duct:

Diameter: 3 mm

Liver:

No focal lesion identified. Within normal limits in parenchymal
echogenicity. Portal vein is patent on color Doppler imaging with
normal direction of blood flow towards the liver.

Other: None.
IMPRESSION: Negative right upper quadrant abdominal ultrasound

## 2021-04-15 ENCOUNTER — Encounter: Payer: Self-pay | Admitting: Otolaryngology

## 2021-04-21 NOTE — Discharge Instructions (Signed)
T & A INSTRUCTION SHEET - [facility]  P. SCOTT BENNETT, MD  [address] [place],  [address] [place]. [phone] [address] [place] [address] [phone]  INFORMATION SHEET FOR A TONSILLECTOMY AND ADENDOIDECTOMY  About Your Tonsils and Adenoids The tonsils and adenoids are normal body tissues that are part of our immune system. They normally help to protect us against diseases that may enter our mouth and nose. However, sometimes the tonsils and/or adenoids become too large and obstruct our breathing, especially at night.  If either of these things happen it helps to remove the tonsils and adenoids in order to become healthier. The operation to remove the tonsils and adenoids is called a tonsillectomy and adenoidectomy.  The Location of Your Tonsils and Adenoids The tonsils are located in the back of the throat on both side and sit in a cradle of muscles. The adenoids are located in the roof of the mouth, behind the nose, and closely associated with the opening of the Eustachian tube to the ear.  Surgery on Tonsils and Adenoids A tonsillectomy and adenoidectomy is a short operation which takes about thirty minutes. This includes being put to sleep and being awakened. Tonsillectomies and adenoidectomies are performed at [facility] and may require observation period in the recovery room prior to going home. Children are required to remain in the recovery area for 45 minutes after surgery.  Following the Operation for a Tonsillectomy A cautery machine is used to control bleeding.  Bleeding from a tonsillectomy and adenoidectomy is minimal and postoperatively the risk of bleeding is approximately four percent, although this rarely life threatening.  After your tonsillectomy and adenoidectomy post-op care at home: 1. Our patients are able to go home the same day. You may be given prescriptions for  pain medications and antibiotics, if indicated. 2. It is extremely important to remember that fluid intake is of utmost importance after a tonsillectomy. The amount that you drink must be maintained in the postoperative period. A good indication of whether a child is getting enough fluid is whether his/her urine output is constant.  As long as children are urinating or wetting their diaper every 6 - 8 hours this is usually enough fluid intake.   3. Although rare, this is a risk of some bleeding in the first ten days after surgery. This usually occurs between day five and nine postoperatively. This risk of bleeding is approximately four percent.  If you or your child should have any bleeding you should remain calm and notify our office or go directly to the Emergency Room at [facility] where they will contact us. Our doctors are available seven days a week for notification. We recommend sitting up quietly in a chair, place an ice pack on the front of the neck and spitting out the blood gently until we are able to contact you. Adults should gargle gently with ice water and this may help stop the bleeding. If the bleeding does not stop after a short time, i.e. 10 to 15 minutes, or seems to be increasing again, please contact us or go to the hospital.   4. It is common for the pain to be worse at 5 - 7 days postoperatively. This occurs because the "scab" is peeling off and the mucous membrane (skin of the throat) is growing back where the tonsils were.   5. It is common for a low-grade fever,   less than 102, during the first week after a tonsillectomy and adenoidectomy. It is usually due to not drinking enough liquids, and we suggest your use liquid Tylenol (acetaminophen) or the pain medicine with Tylenol (acetaminophen) prescribed in order to keep your temperature below 102. Please follow the directions on the back of the bottle. 6. Do not take aspirin or any products that contain aspirin  such as Bufferin, Anacin, Ecotrin, aspirin gum, Goodies, BC headache powders, etc., after a T&A because it can promote bleeding.  DO NOT TAKE MOTRIN OR IBUPROFEN. Please check with our office before administering any other medication that may been prescribed by other doctors during the two-week post-operative period. 7. If you happen to look in the mirror or into your child's mouth you will see white/gray patches on the back of the throat.  This is what a scab looks like in the mouth and is normal after having a tonsillectomy and adenoidectomy. It will disappear once the tonsil area heals completely. However, it may cause a noticeable odor, and this too will disappear with time.     8. You or your child may experience ear pain after having a tonsillectomy and adenoidectomy. This is called referred pain and comes from the throat, but it is felt in the ears. Ear pain is quite common and expected. It will usually go away after ten days. There is usually nothing wrong with the ears, and it is primarily due to the healing area stimulating the nerve to the ear that runs along the side of the throat. Use either the prescribed pain medicine or Tylenol (acetaminophen) as needed.  9. The throat tissues after a tonsillectomy are obviously sensitive. Smoking around children who have had a tonsillectomy significantly increases the risk of bleeding.  DO NOT SMOKE! What to Expect Each Day  First Day at Home 1. Patients will be discharged home the same day.  2. Drink at least four glasses of liquid a day. Clear, cool liquids are recommended. Fruit juices containing citric acid are not recommended because they tend to cause pain. Carbonated beverages are allowed if you pour them from glass to glass to remove the bubbles as these tend to cause discomfort. Avoid alcoholic beverages.  3. Eat very soft foods such as soups, broth, jello, custard, pudding, ice cream, popsicles, applesauce, mashed potatoes, and in general anything  that you can crush between your tongue and the roof of your mouth. Try adding Carnation Instant Breakfast Mix into your food for extra calories. It is not uncommon to lose 5 to 10 pounds of fluid weight. The weight will be gained back quickly once you're feeling better and drinking more.  4. Sleep with your head elevated on two pillows for about three days to help decrease the swelling.  5. DO NOT SMOKE!  Day Two  1. Rest as much as possible. Use common sense in your activities.  2. Continue drinking at least four glasses of liquid per day.  3. Follow the soft diet.  4. Use your pain medication as needed.  Day Three  1. Advance your activity as you are able and continue to follow the previous day's suggestions.  Days Four Through Six  1. Advance your diet and begin to eat more solid foods such as chopped hamburger. 2. Advance your activities slowly. Children should be kept mostly around the house.  3. Not uncommonly, there will be more pain at this time. It is temporary, usually lasting a day or two.  Day Seven   Through Ten  1. Most individuals by this time are able to return to work or school unless otherwise instructed. Consider sending children back to school for a half day on the first day back.  

## 2021-04-25 NOTE — Anesthesia Preprocedure Evaluation (Addendum)
Anesthesia Evaluation  Patient identified by MRN, date of birth, ID band Patient awake    Reviewed: Allergy & Precautions, NPO status , Patient's Chart, lab work & pertinent test results  History of Anesthesia Complications Negative for: history of anesthetic complications  Airway Mallampati: I   Neck ROM: Full    Dental no notable dental hx.    Pulmonary asthma , Current Smoker (smokes 6-8 cigarettes per day) and Patient abstained from smoking.,    Pulmonary exam normal breath sounds clear to auscultation       Cardiovascular Exercise Tolerance: Good negative cardio ROS Normal cardiovascular exam Rhythm:Regular Rate:Normal     Neuro/Psych PSYCHIATRIC DISORDERS (ADHD) negative neurological ROS     GI/Hepatic Crohn's disease   Endo/Other  negative endocrine ROS  Renal/GU negative Renal ROS     Musculoskeletal   Abdominal   Peds  Hematology  (+) Blood dyscrasia, anemia ,   Anesthesia Other Findings   Reproductive/Obstetrics                            Anesthesia Physical Anesthesia Plan  ASA: 2  Anesthesia Plan: General   Post-op Pain Management:    Induction: Intravenous  PONV Risk Score and Plan: 2 and Ondansetron, Dexamethasone and Treatment may vary due to age or medical condition  Airway Management Planned: Oral ETT  Additional Equipment:   Intra-op Plan:   Post-operative Plan: Extubation in OR  Informed Consent: I have reviewed the patients History and Physical, chart, labs and discussed the procedure including the risks, benefits and alternatives for the proposed anesthesia with the patient or authorized representative who has indicated his/her understanding and acceptance.       Plan Discussed with: CRNA  Anesthesia Plan Comments:        Anesthesia Quick Evaluation

## 2021-04-27 ENCOUNTER — Encounter: Payer: Self-pay | Admitting: Otolaryngology

## 2021-04-27 ENCOUNTER — Other Ambulatory Visit: Payer: Self-pay

## 2021-04-27 ENCOUNTER — Ambulatory Visit: Payer: Medicaid Other | Admitting: Anesthesiology

## 2021-04-27 ENCOUNTER — Ambulatory Visit
Admission: RE | Admit: 2021-04-27 | Discharge: 2021-04-27 | Disposition: A | Discharge status: Home / Self Care | Payer: Medicaid Other | Attending: Otolaryngology | Admitting: Otolaryngology

## 2021-04-27 ENCOUNTER — Encounter
Admission: RE | Disposition: A | Discharge status: Home / Self Care | Payer: Self-pay | Source: Home / Self Care | Attending: Otolaryngology

## 2021-04-27 DIAGNOSIS — J3501 Chronic tonsillitis: Secondary | ICD-10-CM | POA: Diagnosis present

## 2021-04-27 DIAGNOSIS — F17200 Nicotine dependence, unspecified, uncomplicated: Secondary | ICD-10-CM | POA: Insufficient documentation

## 2021-04-27 DIAGNOSIS — R599 Enlarged lymph nodes, unspecified: Secondary | ICD-10-CM | POA: Insufficient documentation

## 2021-04-27 HISTORY — PX: TONSILLECTOMY: SHX5217

## 2021-04-27 LAB — POCT PREGNANCY, URINE: Preg Test, Ur: NEGATIVE

## 2021-04-27 SURGERY — TONSILLECTOMY
Anesthesia: General | Site: Throat | Laterality: Bilateral

## 2021-04-27 MED ORDER — ONDANSETRON HCL 4 MG/2ML IJ SOLN: 4.0000 mg | Freq: Once | INTRAMUSCULAR | Status: DC | PRN

## 2021-04-27 MED ORDER — GLYCOPYRROLATE 0.2 MG/ML IJ SOLN
INTRAMUSCULAR | Status: DC | PRN
  Administered 2021-04-27: .1 mg via INTRAVENOUS

## 2021-04-27 MED ORDER — HYDROCODONE-ACETAMINOPHEN 7.5-325 MG/15ML PO SOLN: ORAL | 0 refills | Status: AC

## 2021-04-27 MED ORDER — FENTANYL CITRATE (PF) 100 MCG/2ML IJ SOLN
INTRAMUSCULAR | Status: DC | PRN
  Administered 2021-04-27 (×2): 50 ug via INTRAVENOUS

## 2021-04-27 MED ORDER — ONDANSETRON HCL 4 MG/2ML IJ SOLN
INTRAMUSCULAR | Status: DC | PRN
  Administered 2021-04-27: 4 mg via INTRAVENOUS

## 2021-04-27 MED ORDER — OXYCODONE HCL 5 MG PO TABS: 5.0000 mg | ORAL_TABLET | Freq: Once | ORAL | Status: AC | PRN

## 2021-04-27 MED ORDER — BUPIVACAINE HCL 0.25 % IJ SOLN
INTRAMUSCULAR | Status: DC | PRN
  Administered 2021-04-27: 5 mL

## 2021-04-27 MED ORDER — MIDAZOLAM HCL 5 MG/5ML IJ SOLN
INTRAMUSCULAR | Status: DC | PRN
  Administered 2021-04-27: 2 mg via INTRAVENOUS

## 2021-04-27 MED ORDER — LACTATED RINGERS IV SOLN: INTRAVENOUS | Status: DC

## 2021-04-27 MED ORDER — PROPOFOL 10 MG/ML IV BOLUS
INTRAVENOUS | Status: DC | PRN
  Administered 2021-04-27: 150 mg via INTRAVENOUS

## 2021-04-27 MED ORDER — FENTANYL CITRATE (PF) 100 MCG/2ML IJ SOLN
25.0000 ug | INTRAMUSCULAR | Status: DC | PRN
  Administered 2021-04-27 (×2): 50 ug via INTRAVENOUS

## 2021-04-27 MED ORDER — OXYCODONE HCL 5 MG/5ML PO SOLN
5.0000 mg | Freq: Once | ORAL | Status: AC | PRN
  Administered 2021-04-27: 5 mg via ORAL

## 2021-04-27 MED ORDER — DEXMEDETOMIDINE HCL 200 MCG/2ML IV SOLN
INTRAVENOUS | Status: DC | PRN
  Administered 2021-04-27: 10 ug via INTRAVENOUS

## 2021-04-27 MED ORDER — PREDNISOLONE SODIUM PHOSPHATE 15 MG/5ML PO SOLN: ORAL | 0 refills | Status: AC

## 2021-04-27 MED ORDER — SUCCINYLCHOLINE CHLORIDE 20 MG/ML IJ SOLN
INTRAMUSCULAR | Status: DC | PRN
  Administered 2021-04-27: 80 mg via INTRAVENOUS

## 2021-04-27 MED ORDER — ACETAMINOPHEN 10 MG/ML IV SOLN
1000.0000 mg | Freq: Once | INTRAVENOUS | Status: AC
  Administered 2021-04-27: 1000 mg via INTRAVENOUS

## 2021-04-27 MED ORDER — DEXAMETHASONE SODIUM PHOSPHATE 4 MG/ML IJ SOLN
INTRAMUSCULAR | Status: DC | PRN
  Administered 2021-04-27: 4 mg via INTRAVENOUS

## 2021-04-27 MED ORDER — LIDOCAINE HCL (CARDIAC) PF 100 MG/5ML IV SOSY
PREFILLED_SYRINGE | INTRAVENOUS | Status: DC | PRN
  Administered 2021-04-27: 50 mg via INTRAVENOUS

## 2021-04-27 SURGICAL SUPPLY — 19 items
BLADE BOVIE TIP EXT 4 (BLADE) ×2 IMPLANT
CANISTER SUCT 1200ML W/VALVE (MISCELLANEOUS) ×2 IMPLANT
CATH ROBINSON RED A/P 10FR (CATHETERS) ×2 IMPLANT
COAG SUCT 10F 3.5MM HAND CTRL (MISCELLANEOUS) ×2 IMPLANT
DECANTER SPIKE VIAL GLASS SM (MISCELLANEOUS) ×2 IMPLANT
ELECT REM PT RETURN 9FT ADLT (ELECTROSURGICAL) ×2
ELECTRODE REM PT RTRN 9FT ADLT (ELECTROSURGICAL) ×1 IMPLANT
GLOVE SURG ENC MOIS LTX SZ7.5 (GLOVE) ×2 IMPLANT
IV NS 500ML (IV SOLUTION)
IV NS 500ML BAXH (IV SOLUTION) IMPLANT
IV SET PRIMARY 60D N/DEHP TUR (IV SETS) IMPLANT
KIT TURNOVER KIT A (KITS) ×2 IMPLANT
NS IRRIG 500ML POUR BTL (IV SOLUTION) ×2 IMPLANT
PACK TONSIL AND ADENOID CUSTOM (PACKS) ×2 IMPLANT
PENCIL SMOKE EVACUATOR (MISCELLANEOUS) ×2 IMPLANT
SLEEVE SUCTION 125 (MISCELLANEOUS) ×2 IMPLANT
SOL ANTI-FOG 6CC FOG-OUT (MISCELLANEOUS) ×1 IMPLANT
SOL FOG-OUT ANTI-FOG 6CC (MISCELLANEOUS) ×1
SPONGE TONSIL .75 RFD DBL STRL (DISPOSABLE) ×2 IMPLANT

## 2021-04-27 NOTE — Transfer of Care (Signed)
Immediate Anesthesia Transfer of Care Note  Patient: Michelle Benton  Procedure(s) Performed: TONSILLECTOMY (Bilateral: Throat)  Patient Location: PACU  Anesthesia Type: General  Level of Consciousness: awake, alert  and patient cooperative  Airway and Oxygen Therapy: Patient Spontanous Breathing and Patient connected to supplemental oxygen  Post-op Assessment: Post-op Vital signs reviewed, Patient's Cardiovascular Status Stable, Respiratory Function Stable, Patent Airway and No signs of Nausea or vomiting  Post-op Vital Signs: Reviewed and stable  Complications: No notable events documented.

## 2021-04-27 NOTE — Op Note (Signed)
04/27/2021  9:32 AM    Darla Belva Bertin  [id number]   Pre-Op Diagnosis:  chronic tonsillitis, tonsillolithiasis  Post-op Diagnosis: chronic tonsillitis, tonsillolithiasis  Procedure: Tonsillectomy  Surgeon:  Sandi Mealy., MD  Anesthesia:  General endotracheal  EBL:  Less than 25 cc  Complications:  None  Findings: 2+ cryptic tonsils with tonsil stones  Procedure: The patient was taken to the Operating Room and placed in the supine position.  After induction of general endotracheal anesthesia, the table was turned 90 degrees and the patient was draped in the usual fashion  with the eyes protected.  A mouth gag was inserted into the oral cavity to open the mouth, and examination of the oropharynx showed the uvula was non-bifid. The palate was palpated, and there was no evidence of submucous cleft. Examination of the nasopharynx showed no obstructing adenoids. The right tonsil was grasped with an Allis clamp and resected from the tonsillar fossa in the usual fashion with the Bovie. The left tonsil was resected in the same fashion. The Bovie was used to obtain hemostasis. Each tonsillar fossa was then carefully injected with 0.25% marcaine , avoiding intravascular injection. The nose and throat were irrigated and suctioned to remove any  blood clot. The mouth gag was  removed with no evidence of active bleeding.  The patient was then returned to the anesthesiologist for awakening, and was taken to the Recovery Room in stable condition.  Cultures:  None.  Specimens:  Tonsils.  Disposition:   PACU to home  Plan: Soft, bland diet and push fluids. Take pain medications and steroids as prescribed. No strenuous activity for 2 weeks. Follow-up in 3 weeks.  Sandi Mealy 04/27/2021 9:32 AM

## 2021-04-27 NOTE — Anesthesia Postprocedure Evaluation (Signed)
Anesthesia Post Note  Patient: Michelle Benton  Procedure(s) Performed: TONSILLECTOMY (Bilateral: Throat)     Patient location during evaluation: PACU Anesthesia Type: General Level of consciousness: awake and alert, oriented and patient cooperative Pain management: pain level controlled Vital Signs Assessment: post-procedure vital signs reviewed and stable Respiratory status: spontaneous breathing, nonlabored ventilation and respiratory function stable Cardiovascular status: blood pressure returned to baseline and stable Postop Assessment: adequate PO intake Anesthetic complications: no   No notable events documented.  Reed Breech

## 2021-04-27 NOTE — H&P (Signed)
History and physical reviewed and will be scanned in later. No change in medical status reported by the patient or family, appears stable for surgery. All questions regarding the procedure answered, and patient (or family if a child) expressed understanding of the procedure. ? ?Michelle Benton Michelle Benton ?@TODAY@ ?

## 2021-04-27 NOTE — Anesthesia Procedure Notes (Signed)
Procedure Name: Intubation Date/Time: 04/27/2021 9:04 AM Performed by: Jinny Blossom, CRNA Pre-anesthesia Checklist: Patient identified, Emergency Drugs available, Suction available, Patient being monitored and Timeout performed Patient Re-evaluated:Patient Re-evaluated prior to induction Oxygen Delivery Method: Circle system utilized Preoxygenation: Pre-oxygenation with 100% oxygen Induction Type: IV induction Ventilation: Mask ventilation without difficulty Laryngoscope Size: Miller and 2 Grade View: Grade I Tube type: Oral Rae Tube size: 6.5 mm Number of attempts: 1 Airway Equipment and Method: Stylet Placement Confirmation: ETT inserted through vocal cords under direct vision, positive ETCO2 and breath sounds checked- equal and bilateral Tube secured with: Tape Dental Injury: Teeth and Oropharynx as per pre-operative assessment

## 2021-04-28 ENCOUNTER — Encounter: Payer: Self-pay | Admitting: Otolaryngology

## 2021-04-28 LAB — SURGICAL PATHOLOGY

## 2021-05-20 ENCOUNTER — Encounter: Payer: Self-pay | Admitting: Otolaryngology

## 2023-02-09 ENCOUNTER — Ambulatory Visit: Payer: Managed Care, Other (non HMO)

## 2023-02-09 DIAGNOSIS — K640 First degree hemorrhoids: Secondary | ICD-10-CM | POA: Diagnosis not present

## 2023-02-09 DIAGNOSIS — D122 Benign neoplasm of ascending colon: Secondary | ICD-10-CM | POA: Diagnosis not present

## 2023-02-09 DIAGNOSIS — R1314 Dysphagia, pharyngoesophageal phase: Secondary | ICD-10-CM | POA: Diagnosis not present

## 2023-02-09 DIAGNOSIS — R194 Change in bowel habit: Secondary | ICD-10-CM | POA: Diagnosis not present

## 2023-02-09 DIAGNOSIS — R1032 Left lower quadrant pain: Secondary | ICD-10-CM | POA: Diagnosis present

## 2023-03-10 ENCOUNTER — Ambulatory Visit: Admit: 2023-03-10 | Payer: Medicaid Other

## 2023-03-10 SURGERY — COLONOSCOPY WITH PROPOFOL
Anesthesia: General

## 2023-04-24 ENCOUNTER — Other Ambulatory Visit: Payer: Self-pay | Admitting: Obstetrics and Gynecology

## 2023-07-18 ENCOUNTER — Inpatient Hospital Stay: Admission: RE | Admit: 2023-07-18 | Payer: Medicaid Other | Source: Ambulatory Visit

## 2023-07-28 ENCOUNTER — Ambulatory Visit: Admit: 2023-07-28 | Payer: Medicaid Other | Admitting: Obstetrics and Gynecology

## 2023-07-28 SURGERY — LAPAROSCOPY, DIAGNOSTIC, ROBOT-ASSISTED
Anesthesia: Choice

## 2023-09-27 ENCOUNTER — Ambulatory Visit: Payer: Medicaid Other

## 2024-09-19 ENCOUNTER — Other Ambulatory Visit: Payer: Self-pay | Admitting: Sports Medicine

## 2024-09-19 DIAGNOSIS — W19XXXA Unspecified fall, initial encounter: Secondary | ICD-10-CM

## 2024-09-19 DIAGNOSIS — M25531 Pain in right wrist: Secondary | ICD-10-CM

## 2024-09-23 ENCOUNTER — Ambulatory Visit
Admission: RE | Admit: 2024-09-23 | Discharge: 2024-09-23 | Disposition: A | Discharge status: Home / Self Care | Source: Ambulatory Visit | Attending: Sports Medicine | Admitting: Sports Medicine

## 2024-09-23 DIAGNOSIS — W19XXXA Unspecified fall, initial encounter: Secondary | ICD-10-CM | POA: Insufficient documentation

## 2024-09-23 DIAGNOSIS — M25531 Pain in right wrist: Secondary | ICD-10-CM | POA: Diagnosis present

## 2024-09-23 DIAGNOSIS — R937 Abnormal findings on diagnostic imaging of other parts of musculoskeletal system: Secondary | ICD-10-CM | POA: Insufficient documentation

## 2024-10-10 ENCOUNTER — Ambulatory Visit: Admitting: Occupational Therapy

## 2024-10-10 ENCOUNTER — Encounter: Payer: Self-pay | Admitting: Occupational Therapy

## 2024-10-10 DIAGNOSIS — M79644 Pain in right finger(s): Secondary | ICD-10-CM | POA: Diagnosis present

## 2024-10-10 NOTE — Therapy (Signed)
 OUTPATIENT OCCUPATIONAL THERAPY ORTHO EVALUATION  Patient Name: Michelle Benton MRN: [id number] DOB:1990-11-20, 33 y.o., female Today's Date: 10/10/2024  PCP: Steva NP REFERRING PROVIDER:Dr Ezra  END OF SESSION:  OT End of Session - 10/10/24 1301     Visit Number 1    Number of Visits 2    Date for Recertification  11/21/24    OT Start Time 1208    OT Stop Time 1238    OT Time Calculation (min) 30 min    Activity Tolerance Patient tolerated treatment well    Behavior During Therapy [facility] for tasks assessed/performed          Past Medical History:  Diagnosis Date   Anemia    Asthma    Premature ventricular contractions 2017   Past Surgical History:  Procedure Laterality Date   NASAL SINUS SURGERY     TONSILLECTOMY Bilateral 04/27/2021   Procedure: TONSILLECTOMY;  Surgeon: Blair Mt, MD;  Location: [facility];  Service: ENT;  Laterality: Bilateral;   Patient Active Problem List   Diagnosis Date Noted   Pregnancy 05/27/2020   Indication for care in labor or delivery 03/21/2020   Nausea and vomiting during pregnancy 03/20/2020   Post-term pregnancy, 40-42 weeks of gestation 06/28/2018   Labor and delivery, indication for care 11/27/2016   First trimester screening 05/30/2016   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   In mid-October, she experienced a fall while jogging after being  knocked over by her dogs, landing on both hands. Since then, she has had persistent pain in her right hand, particularly around the wrist and ulnar side. Initially, the pain was localized to dorsal radial aspect of the wrist and thumb but has since spread. Today, she denies very much pain if any about her thumb or dorsal radial wrist.  The pain is described as burning, especially during activities like peeling apples or holding a fork, and her arm tires quickly during these tasks. She has been wearing a brace intermittently since the injury, initially thinking it contributed to the discomfort. She experiences numbness in her pinky finger, particularly upon waking, and sometimes tingling in the palm. She has altered her use of a computer mouse due to the injury, which she suspects might be affecting her symptoms.  An MRI was performed due to concern for ligament injury in her thumb. She has been wearing a brace more consistently for the past two weeks but finds it challenging to work on a computer with the brace on. Her hand burns by the time she finishes writing a page.  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 No numbness or tingling in other fingers besides the pinky and no pain when crossing her fingers.   MRI Right hand: IMPRESSION: Findings suggestive of subacute tear of the first MCP joint ulnar collateral ligament near the  attachment on the first metacarpal head.  Assessment & Plan: Assessment & Plan Ulnar collateral ligament sprain of right thumb Firm endpoint on exam suggests no complete tear. - Continue brace use for 2-3 weeks. - Refer to therapist for custom splint. - Follow-up in 3 weeks to reassess and consider brace discontinuation.   PRECAUTIONS: Splint wearing follow ortho orders      WEIGHT BEARING RESTRICTIONS: NWB R hand  PAIN:  Are you having pain? 0/10 rest - increase with use end of day 5/10  FALLS: Has patient fallen in last 6 months? 1  - dog tripped her  LIVING ENVIRONMENT: Lives with: lives with their family   PLOF: Independent- pt work check in walk in clinic [facility] - spend time with family outside , read and house work   PATIENT GOALS: be able to keep splint on with using my hand so my thumb can get better   NEXT MD VISIT: 3 wks  OBJECTIVE:  Note: Objective measures were completed at Evaluation unless otherwise noted.  HAND DOMINANCE: Right  ADLs: Anything gripping or pinching with my thumb cause pain   FUNCTIONAL OUTCOME MEASURES: NT today  UPPER EXTREMITY ROM:     Active ROM Right eval Left eval  Shoulder flexion    Shoulder abduction    Shoulder adduction    Shoulder extension    Shoulder internal rotation    Shoulder external rotation    Elbow flexion    Elbow extension    Wrist flexion    Wrist extension    Wrist ulnar deviation    Wrist radial deviation    Wrist pronation    Wrist supination    (Blank rows = not tested) Wrist AROM WNL - pain free in session  Active ROM Right eval Left eval  Thumb MCP (0-60)    Thumb IP (0-80)    Thumb Radial abd/add (0-55)     Thumb Palmar abd/add (0-45)     Thumb Opposition to Small Finger     Index MCP (0-90)     Index PIP (0-100)     Index DIP (0-70)      Long MCP (0-90)      Long PIP (0-100)      Long DIP (0-70)      Ring MCP (0-90)      Ring PIP (0-100)      Ring DIP (0-70)      Little MCP (0-90)      Little PIP (0-100)      Little DIP (0-70)      (Blank rows = not tested)    HAND FUNCTION: NT- in thumb spica  COORDINATION: Decrease - thumb spica   SENSATION: Burning pain over top of hand by the end of day - in prefab splint   EDEMA: none today  COGNITION: Overall cognitive status: Within functional limits for tasks assessed       TREATMENT DATE: 10/10/24  Patient arrived with prefab  thumb spica on right hand. Report unable to wear causing a lot of discomfort and pain in the right hand. Fabricated patient a custom hand-based thumb spica allowing opposition and 3-point pinch. Patient able to use mouse with her computer as well as writing without discomfort or pain Patient was educated in donning and doffing as well as wearing of brace. Patient verbalized understanding and feels very comfortable in the splint. Patient to contact if need any modifications or any issues with splint.  Otherwise we will follow-up with Dr. Ezra in 3 weeks     PATIENT EDUCATION: Education details: findings of eval and HEP  Person educated: Patient Education method: Explanation, Demonstration, Tactile cues, Verbal cues, and Handouts Education comprehension: verbalized understanding, returned demonstration, verbal cues required, and needs further education    GOALS: Goals reviewed with patient? Yes  LONG TERM GOALS: Target date: 6 wks  Patient to be independent in donning and doffing as well as wearing of custom right hand-based thumb spica to be able to use hand in writing and on computer symptom-free Baseline: Patient verbalized and demonstrated understanding.  As well as use of mouse and writing with no symptoms of pain. Goal status: Met  2.  Upgrade goals as needed if referred back for therapy. Baseline:  Goal status: INITIAL   ASSESSMENT:  CLINICAL IMPRESSION: Patient seen today for occupational therapy evaluation for Ulnar collateral ligament sprain of right thumb.  Patient was referred by Dr. Ezra for custom thumb spica splint for patient to be able to use and writing as well as on the computer symptom-free.  Patient arrived with a prefab forearm thumb spica causing a lot of discomfort and pain over the dorsal hand as well as ulnar hand.  Fabricated a custom hand-based right thumb spica for patient to be able to write and use computer symptom-free.  Simulated in the clinic  symptom-free.  Patient was educated on donning and doffing as well as wearing splint correctly.  Verbalized and demonstrated understanding.  Patient can contact me if any issues or discomfort or need modifications.  Otherwise we will follow-up with Dr. Ezra in 3 weeks.  PERFORMANCE DEFICITS: in functional skills including ADLs, IADLs, ROM, strength, pain, flexibility, decreased knowledge of use of DME, and UE functional use,   and psychosocial skills including environmental adaptation and routines and behaviors.   IMPAIRMENTS: are limiting patient from ADLs, IADLs, rest and sleep, play, leisure, and social participation.   COMORBIDITIES: has no other co-morbidities that affects occupational performance. Patient will benefit from skilled OT to address above impairments and improve overall function.  MODIFICATION OR ASSISTANCE TO COMPLETE EVALUATION: No modification of tasks or assist necessary to complete an evaluation.  OT OCCUPATIONAL PROFILE AND HISTORY: Problem focused assessment: Including review of records relating to presenting problem.  CLINICAL DECISION MAKING: LOW - limited treatment options, no task modification necessary  REHAB POTENTIAL: Good for goals  EVALUATION COMPLEXITY: Low      PLAN:  OT FREQUENCY: 2 visits  OT DURATION: 6 weeks  PLANNED INTERVENTIONS: 02831 OT Re-evaluation, 97535 self care/ADL training, 02239 Orthotic Initial, [phone] Orthotic/Prosthetic subsequent, patient/family education, and DME and/or AE instructions    CONSULTED AND AGREED WITH PLAN OF CARE: Patient     Ancel Peters, OTR/L,CLT 10/10/2024, 1:15 PM
